# Patient Record
Sex: Female | Born: 1945 | Race: White | Hispanic: No | Smoking: Former smoker
Health system: Southern US, Community
[De-identification: ages and names within clinical notes are randomized; demographics above are authoritative.]

## PROBLEM LIST (undated history)

## (undated) DIAGNOSIS — C50512 Malignant neoplasm of lower-outer quadrant of left female breast: Principal | ICD-10-CM

## (undated) DIAGNOSIS — E785 Hyperlipidemia, unspecified: Secondary | ICD-10-CM

## (undated) DIAGNOSIS — C4490 Unspecified malignant neoplasm of skin, unspecified: Secondary | ICD-10-CM

## (undated) DIAGNOSIS — F329 Major depressive disorder, single episode, unspecified: Secondary | ICD-10-CM

## (undated) DIAGNOSIS — C50919 Malignant neoplasm of unspecified site of unspecified female breast: Secondary | ICD-10-CM

## (undated) DIAGNOSIS — Z923 Personal history of irradiation: Secondary | ICD-10-CM

## (undated) DIAGNOSIS — M1990 Unspecified osteoarthritis, unspecified site: Secondary | ICD-10-CM

## (undated) DIAGNOSIS — F32A Depression, unspecified: Secondary | ICD-10-CM

## (undated) HISTORY — PX: ABDOMINAL HYSTERECTOMY: SHX81

## (undated) HISTORY — DX: Hyperlipidemia, unspecified: E78.5

## (undated) HISTORY — PX: OTHER SURGICAL HISTORY: SHX169

## (undated) HISTORY — PX: FACIAL COSMETIC SURGERY: SHX629

## (undated) HISTORY — PX: BREAST LUMPECTOMY: SHX2

## (undated) HISTORY — PX: BREAST BIOPSY: SHX20

## (undated) HISTORY — DX: Malignant neoplasm of unspecified site of unspecified female breast: C50.919

## (undated) HISTORY — DX: Malignant neoplasm of lower-outer quadrant of left female breast: C50.512

## (undated) HISTORY — DX: Unspecified malignant neoplasm of skin, unspecified: C44.90

## (undated) HISTORY — PX: CHOLECYSTECTOMY: SHX55

---

## 2000-02-21 ENCOUNTER — Encounter: Payer: Self-pay | Admitting: Obstetrics and Gynecology

## 2000-02-21 ENCOUNTER — Encounter: Admission: RE | Admit: 2000-02-21 | Discharge: 2000-02-21 | Payer: Self-pay | Admitting: Obstetrics and Gynecology

## 2001-09-24 ENCOUNTER — Encounter: Admission: RE | Admit: 2001-09-24 | Discharge: 2001-09-24 | Payer: Self-pay | Admitting: Obstetrics and Gynecology

## 2001-09-24 ENCOUNTER — Encounter: Payer: Self-pay | Admitting: Obstetrics and Gynecology

## 2001-09-29 ENCOUNTER — Encounter: Admission: RE | Admit: 2001-09-29 | Discharge: 2001-09-29 | Payer: Self-pay | Admitting: Obstetrics and Gynecology

## 2001-09-29 ENCOUNTER — Encounter: Payer: Self-pay | Admitting: Obstetrics and Gynecology

## 2002-10-02 ENCOUNTER — Encounter: Admission: RE | Admit: 2002-10-02 | Discharge: 2002-10-02 | Payer: Self-pay | Admitting: Obstetrics and Gynecology

## 2002-10-02 ENCOUNTER — Encounter: Payer: Self-pay | Admitting: Obstetrics and Gynecology

## 2003-09-20 ENCOUNTER — Ambulatory Visit (HOSPITAL_COMMUNITY): Admission: RE | Admit: 2003-09-20 | Discharge: 2003-09-20 | Payer: Self-pay | Admitting: Gastroenterology

## 2003-09-20 ENCOUNTER — Encounter (INDEPENDENT_AMBULATORY_CARE_PROVIDER_SITE_OTHER): Payer: Self-pay | Admitting: *Deleted

## 2003-10-08 ENCOUNTER — Encounter: Admission: RE | Admit: 2003-10-08 | Discharge: 2003-10-08 | Payer: Self-pay | Admitting: Obstetrics and Gynecology

## 2004-11-10 ENCOUNTER — Encounter: Admission: RE | Admit: 2004-11-10 | Discharge: 2004-11-10 | Payer: Self-pay | Admitting: Internal Medicine

## 2005-11-27 ENCOUNTER — Encounter: Admission: RE | Admit: 2005-11-27 | Discharge: 2005-11-27 | Payer: Self-pay | Admitting: Internal Medicine

## 2007-02-14 ENCOUNTER — Encounter: Admission: RE | Admit: 2007-02-14 | Discharge: 2007-02-14 | Payer: Self-pay | Admitting: Internal Medicine

## 2008-03-11 ENCOUNTER — Encounter: Admission: RE | Admit: 2008-03-11 | Discharge: 2008-03-11 | Payer: Self-pay | Admitting: Internal Medicine

## 2008-07-11 ENCOUNTER — Emergency Department (HOSPITAL_COMMUNITY): Admission: EM | Admit: 2008-07-11 | Discharge: 2008-07-11 | Payer: Self-pay | Admitting: Emergency Medicine

## 2009-03-14 ENCOUNTER — Encounter: Admission: RE | Admit: 2009-03-14 | Discharge: 2009-03-14 | Payer: Self-pay | Admitting: Internal Medicine

## 2010-02-25 ENCOUNTER — Other Ambulatory Visit: Payer: Self-pay | Admitting: Internal Medicine

## 2010-02-25 DIAGNOSIS — Z1239 Encounter for other screening for malignant neoplasm of breast: Secondary | ICD-10-CM

## 2010-03-27 ENCOUNTER — Ambulatory Visit
Admission: RE | Admit: 2010-03-27 | Discharge: 2010-03-27 | Disposition: A | Discharge status: Home / Self Care | Payer: Medicare Other | Source: Ambulatory Visit | Attending: Internal Medicine | Admitting: Internal Medicine

## 2010-03-27 DIAGNOSIS — Z1239 Encounter for other screening for malignant neoplasm of breast: Secondary | ICD-10-CM

## 2010-06-23 NOTE — Op Note (Signed)
NAME:  Stacey Barrett, Stacey Barrett                       ACCOUNT NO.:  [id number]   MEDICAL RECORD NO.:  [id number]                   PATIENT TYPE:  AMB   LOCATION:  ENDO                                 FACILITY:  [facility]   PHYSICIAN:  Anselmo Rod, M.D.               DATE OF BIRTH:  [birth year]   DATE OF PROCEDURE:  09/20/2003  DATE OF DISCHARGE:                                 OPERATIVE REPORT   PROCEDURE PERFORMED:  Colonoscopy with cold biopsies times two.   ENDOSCOPIST:  Charna Elizabeth, M.D.   INSTRUMENT USED:  Olympus video colonoscope.   INDICATIONS FOR PROCEDURE:  The patient is a 65 year old white female with a  family history of colon cancer undergoing screening colonoscopy.  Rule out  colonic polyps, masses, etc.   PREPROCEDURE PREPARATION:  Informed consent was procured from the patient.  The patient was fasted for eight hours prior to the procedure and prepped  with a bottle of magnesium citrate and a gallon of GoLYTELY the night prior  to the procedure.   PREPROCEDURE PHYSICAL:  The patient had stable vital signs.  Neck supple.  Chest clear to auscultation.  S1 and S2 regular.  Abdomen soft with normal  bowel sounds.   DESCRIPTION OF PROCEDURE:  The patient was placed in left lateral decubitus  position and sedated with 70 mg of Demerol and 7.5 mg of Versed in slow  incremental doses.  Once the patient was adequately sedated and maintained  on low flow oxygen and continuous cardiac monitoring, the Olympus video  colonoscope was advanced from the rectum to the cecum.  The appendicular  orifice and ileocecal valve were clearly visualized and photographed.  A  small sessile polyp was biopsied from the cecal base.  The patient's  position was changed from the left lateral to the supine position with  gentle application of abdominal pressure to reach the cecal base.  The  terminal ileum appeared healthy and without lesions.  Small internal  hemorrhoids were seen on retroflexion.   The rest of the colonic mucosa  appeared healthy.  There was no evidence of diverticulosis.  The patient  tolerated the procedure well without immediate complications.   IMPRESSION:  1. Small nonbleeding internal hemorrhoids.  2. Small sessile polyp biopsied from the cecal base times two (cold     biopsied).  3. No evidence of diverticulosis.  4. Normal terminal ileum.   RECOMMENDATIONS:  1. Await pathology results.  2. Repeat colorectal cancer screening depending on pathology results.  3. Outpatient followup as need arises in the future.  4. Continue high fiber diet with liberal fluid intake.                                               Anselmo Rod, M.D.  JNM/MEDQ  D:  09/20/2003  T:  09/20/2003  Job:  [id number]   cc:   Merlene Laughter. Renae Gloss, M.D.  [address]  [address]  [place]  [place] [address]  Fax: [phone]

## 2011-01-18 ENCOUNTER — Other Ambulatory Visit: Payer: Self-pay | Admitting: Ophthalmology

## 2011-02-21 ENCOUNTER — Other Ambulatory Visit: Payer: Self-pay | Admitting: Internal Medicine

## 2011-02-21 DIAGNOSIS — Z1231 Encounter for screening mammogram for malignant neoplasm of breast: Secondary | ICD-10-CM

## 2011-04-03 ENCOUNTER — Ambulatory Visit
Admission: RE | Admit: 2011-04-03 | Discharge: 2011-04-03 | Disposition: A | Discharge status: Home / Self Care | Payer: Medicare Other | Source: Ambulatory Visit | Attending: Internal Medicine | Admitting: Internal Medicine

## 2011-04-03 DIAGNOSIS — Z1231 Encounter for screening mammogram for malignant neoplasm of breast: Secondary | ICD-10-CM

## 2011-04-06 ENCOUNTER — Other Ambulatory Visit: Payer: Self-pay | Admitting: Internal Medicine

## 2011-04-06 DIAGNOSIS — R928 Other abnormal and inconclusive findings on diagnostic imaging of breast: Secondary | ICD-10-CM

## 2011-04-17 ENCOUNTER — Ambulatory Visit
Admission: RE | Admit: 2011-04-17 | Discharge: 2011-04-17 | Disposition: A | Discharge status: Home / Self Care | Payer: Medicare Other | Source: Ambulatory Visit | Attending: Internal Medicine | Admitting: Internal Medicine

## 2011-04-17 DIAGNOSIS — R928 Other abnormal and inconclusive findings on diagnostic imaging of breast: Secondary | ICD-10-CM

## 2012-03-27 ENCOUNTER — Other Ambulatory Visit: Payer: Self-pay | Admitting: Internal Medicine

## 2012-04-03 ENCOUNTER — Other Ambulatory Visit: Payer: Self-pay

## 2012-04-03 DIAGNOSIS — Z1231 Encounter for screening mammogram for malignant neoplasm of breast: Secondary | ICD-10-CM

## 2012-04-24 ENCOUNTER — Ambulatory Visit
Admission: RE | Admit: 2012-04-24 | Discharge: 2012-04-24 | Disposition: A | Discharge status: Home / Self Care | Payer: Medicare Other | Source: Ambulatory Visit | Attending: Internal Medicine | Admitting: Internal Medicine

## 2012-04-24 ENCOUNTER — Ambulatory Visit: Payer: Medicare Other

## 2012-04-24 DIAGNOSIS — Z1231 Encounter for screening mammogram for malignant neoplasm of breast: Secondary | ICD-10-CM

## 2012-04-24 DIAGNOSIS — Z803 Family history of malignant neoplasm of breast: Secondary | ICD-10-CM

## 2012-04-29 ENCOUNTER — Other Ambulatory Visit: Payer: Self-pay | Admitting: Internal Medicine

## 2012-04-29 DIAGNOSIS — R928 Other abnormal and inconclusive findings on diagnostic imaging of breast: Secondary | ICD-10-CM

## 2012-05-06 ENCOUNTER — Ambulatory Visit
Admission: RE | Admit: 2012-05-06 | Discharge: 2012-05-06 | Disposition: A | Discharge status: Home / Self Care | Payer: Medicare Other | Source: Ambulatory Visit | Attending: Internal Medicine | Admitting: Internal Medicine

## 2012-05-06 DIAGNOSIS — R928 Other abnormal and inconclusive findings on diagnostic imaging of breast: Secondary | ICD-10-CM

## 2012-05-09 ENCOUNTER — Other Ambulatory Visit: Payer: Medicare Other

## 2013-03-18 ENCOUNTER — Other Ambulatory Visit: Payer: Self-pay

## 2013-03-18 DIAGNOSIS — Z1231 Encounter for screening mammogram for malignant neoplasm of breast: Secondary | ICD-10-CM

## 2013-05-04 ENCOUNTER — Ambulatory Visit
Admission: RE | Admit: 2013-05-04 | Discharge: 2013-05-04 | Disposition: A | Discharge status: Home / Self Care | Payer: Medicare Other | Source: Ambulatory Visit

## 2013-05-04 DIAGNOSIS — Z1231 Encounter for screening mammogram for malignant neoplasm of breast: Secondary | ICD-10-CM

## 2013-11-24 ENCOUNTER — Ambulatory Visit
Admission: RE | Admit: 2013-11-24 | Discharge: 2013-11-24 | Disposition: A | Discharge status: Home / Self Care | Payer: Medicare Other | Source: Ambulatory Visit | Attending: Internal Medicine | Admitting: Internal Medicine

## 2013-11-24 ENCOUNTER — Other Ambulatory Visit: Payer: Self-pay | Admitting: Internal Medicine

## 2013-11-24 DIAGNOSIS — M25551 Pain in right hip: Secondary | ICD-10-CM

## 2013-11-24 DIAGNOSIS — M25552 Pain in left hip: Principal | ICD-10-CM

## 2014-01-13 ENCOUNTER — Emergency Department (HOSPITAL_COMMUNITY): Payer: Medicare Other

## 2014-01-13 ENCOUNTER — Encounter (HOSPITAL_COMMUNITY): Payer: Self-pay | Admitting: Emergency Medicine

## 2014-01-13 ENCOUNTER — Emergency Department (HOSPITAL_COMMUNITY)
Admission: EM | Admit: 2014-01-13 | Discharge: 2014-01-13 | Disposition: A | Discharge status: Home / Self Care | Payer: Medicare Other | Attending: Emergency Medicine | Admitting: Emergency Medicine

## 2014-01-13 DIAGNOSIS — R1032 Left lower quadrant pain: Secondary | ICD-10-CM | POA: Diagnosis present

## 2014-01-13 DIAGNOSIS — Z9049 Acquired absence of other specified parts of digestive tract: Secondary | ICD-10-CM | POA: Insufficient documentation

## 2014-01-13 DIAGNOSIS — Z90710 Acquired absence of both cervix and uterus: Secondary | ICD-10-CM | POA: Diagnosis not present

## 2014-01-13 DIAGNOSIS — N23 Unspecified renal colic: Secondary | ICD-10-CM | POA: Diagnosis not present

## 2014-01-13 LAB — URINE MICROSCOPIC-ADD ON

## 2014-01-13 LAB — CBC WITH DIFFERENTIAL/PLATELET
BASOS ABS: 0.1 10*3/uL (ref 0.0–0.1)
Basophils Relative: 1 % (ref 0–1)
EOS ABS: 0.1 10*3/uL (ref 0.0–0.7)
EOS PCT: 1 % (ref 0–5)
HEMATOCRIT: 43.2 % (ref 36.0–46.0)
Hemoglobin: 13.9 g/dL (ref 12.0–15.0)
LYMPHS PCT: 11 % — AB (ref 12–46)
Lymphs Abs: 1.2 10*3/uL (ref 0.7–4.0)
MCH: 29.4 pg (ref 26.0–34.0)
MCHC: 32.2 g/dL (ref 30.0–36.0)
MCV: 91.5 fL (ref 78.0–100.0)
MONO ABS: 1.1 10*3/uL — AB (ref 0.1–1.0)
Monocytes Relative: 11 % (ref 3–12)
Neutro Abs: 8 10*3/uL — ABNORMAL HIGH (ref 1.7–7.7)
Neutrophils Relative %: 76 % (ref 43–77)
PLATELETS: 263 10*3/uL (ref 150–400)
RBC: 4.72 MIL/uL (ref 3.87–5.11)
RDW: 14 % (ref 11.5–15.5)
WBC: 10.4 10*3/uL (ref 4.0–10.5)

## 2014-01-13 LAB — URINALYSIS, ROUTINE W REFLEX MICROSCOPIC
Glucose, UA: NEGATIVE mg/dL
Ketones, ur: NEGATIVE mg/dL
Nitrite: NEGATIVE
SPECIFIC GRAVITY, URINE: 1.037 — AB (ref 1.005–1.030)
Urobilinogen, UA: 1 mg/dL (ref 0.0–1.0)
pH: 5.5 (ref 5.0–8.0)

## 2014-01-13 LAB — BASIC METABOLIC PANEL
ANION GAP: 16 — AB (ref 5–15)
BUN: 26 mg/dL — AB (ref 6–23)
CO2: 21 mEq/L (ref 19–32)
Calcium: 10.1 mg/dL (ref 8.4–10.5)
Chloride: 97 mEq/L (ref 96–112)
Creatinine, Ser: 0.87 mg/dL (ref 0.50–1.10)
GFR calc Af Amer: 76 mL/min — ABNORMAL LOW (ref >90)
GFR, EST NON AFRICAN AMERICAN: 66 mL/min — AB (ref >90)
GLUCOSE: 113 mg/dL — AB (ref 70–99)
Potassium: 4.3 mEq/L (ref 3.7–5.3)
SODIUM: 134 meq/L — AB (ref 137–147)

## 2014-01-13 MED ORDER — ONDANSETRON HCL 4 MG PO TABS: 4.0000 mg | ORAL_TABLET | Freq: Four times a day (QID) | ORAL | Status: DC

## 2014-01-13 MED ORDER — HYDROCODONE-ACETAMINOPHEN 5-325 MG PO TABS: 1.0000 | ORAL_TABLET | Freq: Four times a day (QID) | ORAL | Status: DC | PRN

## 2014-01-13 MED ORDER — TAMSULOSIN HCL 0.4 MG PO CAPS: 0.4000 mg | ORAL_CAPSULE | Freq: Every day | ORAL | Status: DC

## 2014-01-13 MED ORDER — HYDROMORPHONE HCL 1 MG/ML IJ SOLN
1.0000 mg | Freq: Once | INTRAMUSCULAR | Status: AC
  Administered 2014-01-13: 1 mg via INTRAVENOUS
  Filled 2014-01-13: qty 1

## 2014-01-13 MED ORDER — SODIUM CHLORIDE 0.9 % IV BOLUS (SEPSIS)
1000.0000 mL | Freq: Once | INTRAVENOUS | Status: AC
  Administered 2014-01-13: 1000 mL via INTRAVENOUS

## 2014-01-13 NOTE — ED Notes (Signed)
Pt is having left side flank and blood in urine, doctor sent pt over to rule lout kidney stone.

## 2014-01-13 NOTE — ED Provider Notes (Signed)
CSN: [id number]     Arrival date & time 01/13/14  2423 History   First MD Initiated Contact with Patient 01/13/14 0930     Chief Complaint  Patient presents with  . Flank Pain    left      Patient is a 68 y.o. female presenting with flank pain. The history is provided by the patient.  Flank Pain   Pt presents for evaluation of left lower quadrant pain.  For the last 2 days this patient has had pain in the left lower quadrant that is sharp and intermittent. It is nonradiating. The pain comes on she feels more comfortable with ambulation. She's had multiple episodes of vomiting and chills at home. She reports dysuria and hematuria. She was seen by her PCP on Monday who diagnosed her with a urinary tract infection and she was started on Cipro twice a day. She's been taking the Cipro as prescribed and her vomiting has decreased. She does report continued nausea. She has no reported fevers. He developed increased hematuria with passing clots in her urine and she comes in for further evaluation. She denies any flank pain, cough, chest pain. She denies history of kidney stones. She takes no blood thinners. She lives alone. Symptoms are moderate, intermittent, worsening.  History reviewed. No pertinent past medical history. Past Surgical History  Procedure Laterality Date  . Cholecystectomy    . Abdominal hysterectomy     No family history on file. History  Substance Use Topics  . Smoking status: Never Smoker   . Smokeless tobacco: Not on file  . Alcohol Use: Yes     Comment: ocassional   OB History    No data available     Review of Systems  Genitourinary: Positive for flank pain.  All other systems reviewed and are negative.     Allergies  Review of patient's allergies indicates no known allergies.  Home Medications   Prior to Admission medications   Medication Sig Start Date End Date Taking? Authorizing Provider  ciprofloxacin (CIPRO) 500 MG tablet Take 500 mg by mouth 2  (two) times daily. For 7 days 01/11/14  Yes Historical Provider, MD  ibuprofen (ADVIL,MOTRIN) 200 MG tablet Take 400 mg by mouth every 6 (six) hours as needed for moderate pain.   Yes Historical Provider, MD  promethazine (PHENERGAN) 25 MG tablet Take 25 mg by mouth every 6 (six) hours as needed for nausea.   Yes Historical Provider, MD  sertraline (ZOLOFT) 50 MG tablet Take 50 mg by mouth at bedtime.   Yes Historical Provider, MD   BP 153/72 mmHg  Pulse 93  Temp(Src) 97.9 F (36.6 C) (Oral)  Resp 20  SpO2 97% Physical Exam  Constitutional: She is oriented to person, place, and time. She appears well-developed and well-nourished.  HENT:  Head: Normocephalic and atraumatic.  Cardiovascular: Normal rate and regular rhythm.   No murmur heard. Pulmonary/Chest: Effort normal and breath sounds normal. No respiratory distress.  Abdominal: Soft. There is no rebound and no guarding.  Mild LLQ tenderness  Musculoskeletal: She exhibits no edema or tenderness.  Neurological: She is alert and oriented to person, place, and time.  Skin: Skin is warm and dry.  Psychiatric: She has a normal mood and affect. Her behavior is normal.  Nursing note and vitals reviewed.   ED Course  Procedures (including critical care time) Labs Review Labs Reviewed  URINALYSIS, ROUTINE W REFLEX MICROSCOPIC - Abnormal; Notable for the following:    Color, Urine RED (*)  APPearance TURBID (*)    Specific Gravity, Urine 1.037 (*)    Hgb urine dipstick LARGE (*)    Bilirubin Urine SMALL (*)    Protein, ur >300 (*)    Leukocytes, UA SMALL (*)    All other components within normal limits  BASIC METABOLIC PANEL - Abnormal; Notable for the following:    Sodium 134 (*)    Glucose, Bld 113 (*)    BUN 26 (*)    GFR calc non Af Amer 66 (*)    GFR calc Af Amer 76 (*)    Anion gap 16 (*)    All other components within normal limits  CBC WITH DIFFERENTIAL - Abnormal; Notable for the following:    Neutro Abs 8.0 (*)     Lymphocytes Relative 11 (*)    Monocytes Absolute 1.1 (*)    All other components within normal limits  URINE MICROSCOPIC-ADD ON - Abnormal; Notable for the following:    Bacteria, UA FEW (*)    Crystals CA OXALATE CRYSTALS (*)    All other components within normal limits  URINE CULTURE    Imaging Review Ct Abdomen Pelvis Wo Contrast  01/13/2014   CLINICAL DATA:  68 year old female with history of left-sided flank pain and hematuria. Evaluate for potential kidney stone.  EXAM: CT ABDOMEN AND PELVIS WITHOUT CONTRAST  TECHNIQUE: Multidetector CT imaging of the abdomen and pelvis was performed following the standard protocol without IV contrast.  COMPARISON:  No priors.  FINDINGS: Lower chest: Incompletely visualized nodule in the right lower lobe measuring at least 5 mm (image 1 of series 4). There is also an incompletely visualized ground-glass attenuation nodule in the anterior aspect the left lower lobe (image 1 of series 4) measuring 9 x 7 mm.  Hepatobiliary: Status post cholecystectomy. Several subcentimeter low attenuation lesions in the liver are too small to definitively characterize, but are favored to represent tiny cysts. Status post cholecystectomy. Common bile duct measures up to 9 mm in the porta hepatis, likely within normal limits for this patient's age and post cholecystectomy status.  Pancreas: Unremarkable.  Spleen: Unremarkable.  Adrenals/Urinary Tract: 5 mm calculus at or immediately beyond the left ureterovesicular junction (image 77 of series 2). This is associated with moderate left hydroureteronephrosis and extensive perinephric stranding, indicative of obstruction at this time. No additional calculi are identified within the collecting system of either kidney, along the course of the right ureter, or within the lumen of the urinary bladder. The unenhanced appearance of the kidneys is otherwise unremarkable bilaterally.  Stomach/Bowel: The unenhanced appearance of the stomach is  normal. No pathologic dilatation of small bowel or colon.  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 No lymphadenopathy noted in the abdomen or pelvis on today's non contrast CT examination.  Reproductive: Status post hysterectomy. Ovaries are not confidently identified may be surgically absent or atrophic.  Other: No significant volume of ascites.  No pneumoperitoneum.  Musculoskeletal: There are no aggressive appearing lytic or blastic lesions noted in the visualized portions of the skeleton. Degenerative changes are noted adjacent to the symphysis pubis.  IMPRESSION: 1. 5 mm calculus at or immediately beyond the left ureterovesicular junction associated with moderate left hydroureteronephrosis indicative of urinary tract obstruction at this time. 2. No additional urinary tract calculi noted. 3. Small nodules in the lower lobes of the lungs bilaterally, as above. Followup noncontrast chest CT is recommended in 3 months for initial evaluation and to establish a baseline for  future followup, particularly given the left lower lobe ground-glass attenuation nodule. This recommendation follows the consensus statement: Recommendations for the Management of Subsolid Pulmonary Nodules Detected at CT: A Statement from the [place] as published in Radiology 2013; 266:304-317. 4. Atherosclerosis. 5. Additional incidental findings, as above.   Electronically Signed   By: Vinnie Langton M.D.   On: 01/13/2014 10:34     EKG Interpretation None      MDM   Final diagnoses:  None    Patient here for evaluation of hematuria and left lower quadrant pain. CT scan shows obstructing 5 mm calculus. UA is equivocal for UTI and patient has been on ciprofloxacin. Discussed case with Dr. Matilde Sprang who recommends continuing outpatient antibiotics with close return precautions and outpatient follow-up. Patient's pain is resolved in the emergency  department and she has had no new vomiting here. Discussed with patient's close return precautions as well as home care for renal colic.    Quintella Reichert, MD 01/13/14 1739

## 2014-01-13 NOTE — Progress Notes (Signed)
[facility] ED CM noted pt with coverage but no pcp listed Spoke with pt who confirms her pcp is Stacey Barrett EPIC updated  [facility] ED CM spoke with pt on how to obtain an in network specialists if needed with insurance coverage via the customer service number or web site  Cm reviewed ED level of care for crisis/emergent services and community pcp level of care to manage continuous or chronic medical concerns. The pt voiced understanding CM encouraged pt and discussed pt's responsibility to verify with pt's insurance carrier that any recommended medical provider offered by any emergency room or a hospital provider is within the carrier's network. The pt voiced understanding  Answered questions about tests (CT, MRI) to evaluate her abdominal complaint

## 2014-01-13 NOTE — Discharge Instructions (Signed)
You have a stone in your left ureter that is about 5 mm in size.  This stone is causing your pain.  Continue taking your Ciprofloxacin until all pills are gone.  Follow up with the Urologist for recheck.  Come back to the [facility] Emergency Department immediately if you develop fevers or new concerning symptoms.  Your CT scan showed incidental findings such as small nodules on your lungs, these need to followed up by your family doctor.  Please see your family doctor in the next week.    Kidney Stones Kidney stones (urolithiasis) are deposits that form inside your kidneys. The intense pain is caused by the stone moving through the urinary tract. When the stone moves, the ureter goes into spasm around the stone. The stone is usually passed in the urine.  CAUSES   A disorder that makes certain neck glands produce too much parathyroid hormone (primary hyperparathyroidism).  A buildup of uric acid crystals, similar to gout in your joints.  Narrowing (stricture) of the ureter.  A kidney obstruction present at birth (congenital obstruction).  Previous surgery on the kidney or ureters.  Numerous kidney infections. SYMPTOMS   Feeling sick to your stomach (nauseous).  Throwing up (vomiting).  Blood in the urine (hematuria).  Pain that usually spreads (radiates) to the groin.  Frequency or urgency of urination. DIAGNOSIS   Taking a history and physical exam.  Blood or urine tests.  CT scan.  Occasionally, an examination of the inside of the urinary bladder (cystoscopy) is performed. TREATMENT   Observation.  Increasing your fluid intake.  Extracorporeal shock wave lithotripsy--This is a noninvasive procedure that uses shock waves to break up kidney stones.  Surgery may be needed if you have severe pain or persistent obstruction. There are various surgical procedures. Most of the procedures are performed with the use of small instruments. Only small incisions are needed to  accommodate these instruments, so recovery time is minimized. The size, location, and chemical composition are all important variables that will determine the proper choice of action for you. Talk to your health care provider to better understand your situation so that you will minimize the risk of injury to yourself and your kidney.  HOME CARE INSTRUCTIONS   Drink enough water and fluids to keep your urine clear or pale yellow. This will help you to pass the stone or stone fragments.  Strain all urine through the provided strainer. Keep all particulate matter and stones for your health care provider to see. The stone causing the pain may be as small as a grain of salt. It is very important to use the strainer each and every time you pass your urine. The collection of your stone will allow your health care provider to analyze it and verify that a stone has actually passed. The stone analysis will often identify what you can do to reduce the incidence of recurrences.  Only take over-the-counter or prescription medicines for pain, discomfort, or fever as directed by your health care provider.  Make a follow-up appointment with your health care provider as directed.  Get follow-up X-rays if required. The absence of pain does not always mean that the stone has passed. It may have only stopped moving. If the urine remains completely obstructed, it can cause loss of kidney function or even complete destruction of the kidney. It is your responsibility to make sure X-rays and follow-ups are completed. Ultrasounds of the kidney can show blockages and the status of the kidney. Ultrasounds  are not associated with any radiation and can be performed easily in a matter of minutes. SEEK MEDICAL CARE IF:  You experience pain that is progressive and unresponsive to any pain medicine you have been prescribed. SEEK IMMEDIATE MEDICAL CARE IF:   Pain cannot be controlled with the prescribed medicine.  You have a  fever or shaking chills.  The severity or intensity of pain increases over 18 hours and is not relieved by pain medicine.  You develop a new onset of abdominal pain.  You feel faint or pass out.  You are unable to urinate. MAKE SURE YOU:   Understand these instructions.  Will watch your condition.  Will get help right away if you are not doing well or get worse. Document Released: 01/22/2005 Document Revised: 09/24/2012 Document Reviewed: 06/25/2012 [facility] Patient Information 2015 [place], [place]. This information is not intended to replace advice given to you by your health care provider. Make sure you discuss any questions you have with your health care provider.

## 2014-01-14 LAB — URINE CULTURE: Colony Count: 30000

## 2014-03-29 ENCOUNTER — Other Ambulatory Visit: Payer: Self-pay

## 2014-03-29 DIAGNOSIS — Z1231 Encounter for screening mammogram for malignant neoplasm of breast: Secondary | ICD-10-CM

## 2014-04-21 ENCOUNTER — Other Ambulatory Visit: Payer: Self-pay | Admitting: Internal Medicine

## 2014-04-21 DIAGNOSIS — R911 Solitary pulmonary nodule: Secondary | ICD-10-CM

## 2014-04-26 ENCOUNTER — Ambulatory Visit
Admission: RE | Admit: 2014-04-26 | Discharge: 2014-04-26 | Disposition: A | Discharge status: Home / Self Care | Payer: Medicare Other | Source: Ambulatory Visit | Attending: Internal Medicine | Admitting: Internal Medicine

## 2014-04-26 DIAGNOSIS — R911 Solitary pulmonary nodule: Secondary | ICD-10-CM

## 2014-05-06 ENCOUNTER — Ambulatory Visit
Admission: RE | Admit: 2014-05-06 | Discharge: 2014-05-06 | Disposition: A | Discharge status: Home / Self Care | Payer: Medicare Other | Source: Ambulatory Visit

## 2014-05-06 DIAGNOSIS — Z1231 Encounter for screening mammogram for malignant neoplasm of breast: Secondary | ICD-10-CM

## 2015-05-04 ENCOUNTER — Other Ambulatory Visit: Payer: Self-pay

## 2015-05-04 DIAGNOSIS — Z1231 Encounter for screening mammogram for malignant neoplasm of breast: Secondary | ICD-10-CM

## 2015-05-12 ENCOUNTER — Ambulatory Visit
Admission: RE | Admit: 2015-05-12 | Discharge: 2015-05-12 | Disposition: A | Discharge status: Home / Self Care | Payer: Medicare Other | Source: Ambulatory Visit

## 2015-05-12 DIAGNOSIS — Z1231 Encounter for screening mammogram for malignant neoplasm of breast: Secondary | ICD-10-CM

## 2015-05-13 ENCOUNTER — Other Ambulatory Visit: Payer: Self-pay | Admitting: Internal Medicine

## 2015-05-13 DIAGNOSIS — R928 Other abnormal and inconclusive findings on diagnostic imaging of breast: Secondary | ICD-10-CM

## 2015-05-16 ENCOUNTER — Other Ambulatory Visit: Payer: Self-pay | Admitting: Internal Medicine

## 2015-05-16 DIAGNOSIS — R928 Other abnormal and inconclusive findings on diagnostic imaging of breast: Secondary | ICD-10-CM

## 2015-05-20 ENCOUNTER — Ambulatory Visit
Admission: RE | Admit: 2015-05-20 | Discharge: 2015-05-20 | Disposition: A | Discharge status: Home / Self Care | Payer: Medicare Other | Source: Ambulatory Visit | Attending: Internal Medicine | Admitting: Internal Medicine

## 2015-05-20 ENCOUNTER — Other Ambulatory Visit: Payer: Self-pay | Admitting: Internal Medicine

## 2015-05-20 DIAGNOSIS — R928 Other abnormal and inconclusive findings on diagnostic imaging of breast: Secondary | ICD-10-CM

## 2015-05-20 DIAGNOSIS — N6320 Unspecified lump in the left breast, unspecified quadrant: Secondary | ICD-10-CM

## 2015-05-30 ENCOUNTER — Ambulatory Visit
Admission: RE | Admit: 2015-05-30 | Discharge: 2015-05-30 | Disposition: A | Discharge status: Home / Self Care | Payer: Medicare Other | Source: Ambulatory Visit | Attending: Internal Medicine | Admitting: Internal Medicine

## 2015-05-30 ENCOUNTER — Other Ambulatory Visit: Payer: Self-pay | Admitting: Internal Medicine

## 2015-05-30 DIAGNOSIS — N6320 Unspecified lump in the left breast, unspecified quadrant: Secondary | ICD-10-CM

## 2015-06-01 ENCOUNTER — Telehealth: Payer: Self-pay | Admitting: *Deleted

## 2015-06-01 ENCOUNTER — Encounter: Payer: Self-pay | Admitting: *Deleted

## 2015-06-01 DIAGNOSIS — C50512 Malignant neoplasm of lower-outer quadrant of left female breast: Secondary | ICD-10-CM

## 2015-06-01 DIAGNOSIS — Z170 Estrogen receptor positive status [ER+]: Secondary | ICD-10-CM

## 2015-06-01 HISTORY — DX: Malignant neoplasm of lower-outer quadrant of left female breast: C50.512

## 2015-06-01 NOTE — Telephone Encounter (Signed)
Confirmed [facility] for 06/15/15 at 1215 .  Instructions and contact information given.

## 2015-06-01 NOTE — Telephone Encounter (Signed)
Left vm for pt to return to discuss Bee on 5/10. Contact information provided.

## 2015-06-15 ENCOUNTER — Ambulatory Visit: Payer: Medicare Other | Attending: General Surgery | Admitting: Physical Therapy

## 2015-06-15 ENCOUNTER — Ambulatory Visit
Admission: RE | Admit: 2015-06-15 | Discharge: 2015-06-15 | Disposition: A | Discharge status: Home / Self Care | Payer: Medicare Other | Source: Ambulatory Visit | Attending: Radiation Oncology | Admitting: Radiation Oncology

## 2015-06-15 ENCOUNTER — Encounter: Payer: Self-pay | Admitting: Oncology

## 2015-06-15 ENCOUNTER — Other Ambulatory Visit (HOSPITAL_BASED_OUTPATIENT_CLINIC_OR_DEPARTMENT_OTHER): Payer: Medicare Other

## 2015-06-15 ENCOUNTER — Ambulatory Visit (HOSPITAL_BASED_OUTPATIENT_CLINIC_OR_DEPARTMENT_OTHER): Payer: Medicare Other | Admitting: Oncology

## 2015-06-15 ENCOUNTER — Other Ambulatory Visit: Payer: Self-pay | Admitting: General Surgery

## 2015-06-15 ENCOUNTER — Encounter: Payer: Self-pay | Admitting: Physical Therapy

## 2015-06-15 VITALS — BP 144/83 | HR 96 | Temp 98.2°F | Resp 18 | Ht 70.0 in | Wt 163.2 lb

## 2015-06-15 DIAGNOSIS — Z803 Family history of malignant neoplasm of breast: Secondary | ICD-10-CM | POA: Diagnosis not present

## 2015-06-15 DIAGNOSIS — C50512 Malignant neoplasm of lower-outer quadrant of left female breast: Secondary | ICD-10-CM | POA: Diagnosis present

## 2015-06-15 DIAGNOSIS — Z170 Estrogen receptor positive status [ER+]: Secondary | ICD-10-CM | POA: Diagnosis not present

## 2015-06-15 DIAGNOSIS — Z800 Family history of malignant neoplasm of digestive organs: Secondary | ICD-10-CM | POA: Diagnosis not present

## 2015-06-15 DIAGNOSIS — Z86711 Personal history of pulmonary embolism: Secondary | ICD-10-CM

## 2015-06-15 DIAGNOSIS — R293 Abnormal posture: Secondary | ICD-10-CM | POA: Insufficient documentation

## 2015-06-15 DIAGNOSIS — Z85828 Personal history of other malignant neoplasm of skin: Secondary | ICD-10-CM

## 2015-06-15 DIAGNOSIS — Z807 Family history of other malignant neoplasms of lymphoid, hematopoietic and related tissues: Secondary | ICD-10-CM

## 2015-06-15 LAB — COMPREHENSIVE METABOLIC PANEL
ALT: 16 U/L (ref 0–55)
AST: 15 U/L (ref 5–34)
Albumin: 4.4 g/dL (ref 3.5–5.0)
Alkaline Phosphatase: 80 U/L (ref 40–150)
Anion Gap: 10 mEq/L (ref 3–11)
BUN: 20.8 mg/dL (ref 7.0–26.0)
CHLORIDE: 104 meq/L (ref 98–109)
CO2: 25 meq/L (ref 22–29)
Calcium: 10.1 mg/dL (ref 8.4–10.4)
Creatinine: 0.8 mg/dL (ref 0.6–1.1)
EGFR: 74 mL/min/{1.73_m2} — AB (ref >90)
GLUCOSE: 83 mg/dL (ref 70–140)
POTASSIUM: 4.2 meq/L (ref 3.5–5.1)
Sodium: 139 mEq/L (ref 136–145)
Total Bilirubin: 0.58 mg/dL (ref 0.20–1.20)
Total Protein: 7.9 g/dL (ref 6.4–8.3)

## 2015-06-15 LAB — CBC WITH DIFFERENTIAL/PLATELET
BASO%: 0.9 % (ref 0.0–2.0)
BASOS ABS: 0.1 10*3/uL (ref 0.0–0.1)
EOS%: 3.4 % (ref 0.0–7.0)
Eosinophils Absolute: 0.3 10*3/uL (ref 0.0–0.5)
HCT: 44.7 % (ref 34.8–46.6)
HGB: 14.9 g/dL (ref 11.6–15.9)
LYMPH%: 28.8 % (ref 14.0–49.7)
MCH: 29.6 pg (ref 25.1–34.0)
MCHC: 33.3 g/dL (ref 31.5–36.0)
MCV: 88.9 fL (ref 79.5–101.0)
MONO#: 0.9 10*3/uL (ref 0.1–0.9)
MONO%: 9.4 % (ref 0.0–14.0)
NEUT#: 5.8 10*3/uL (ref 1.5–6.5)
NEUT%: 57.5 % (ref 38.4–76.8)
Platelets: 315 10*3/uL (ref 145–400)
RBC: 5.02 10*6/uL (ref 3.70–5.45)
RDW: 14.6 % — AB (ref 11.2–14.5)
WBC: 10.1 10*3/uL (ref 3.9–10.3)
lymph#: 2.9 10*3/uL (ref 0.9–3.3)

## 2015-06-15 NOTE — Progress Notes (Signed)
Stacey Barrett  [phone] [phone]     ID: LANISHA STEPANIAN DOB: 1945/02/21  MR#: [id number]  EXH#:[id number]  Patient Care Team: Willey Blade, MD as PCP - General (Internal Medicine) Chauncey Cruel, MD as Consulting Physician (Oncology) Fanny Skates, MD as Consulting Physician (General Surgery) Eppie Gibson, MD as Attending Physician (Radiation Oncology) Kennieth Francois, MD (Dermatology) Juanita Craver, MD as Consulting Physician (Gastroenterology) PCP: Salena Saner., MD GYN: OTHER MD:  CHIEF COMPLAINT: estrogen receptor positive breast cancer  CURRENT TREATMENT: awaiting definitive surgery   BREAST CANCER HISTORY: Brigetta had routine lateral mammographic screening with tomography at the [facility] 05/12/2015 showing an area of possible asymmetry in the right breast and a possible mass in the left breast. Diagnostic right mammography with tomography and left breast ultrasonography was performed 05/20/2015. Breast density was category B. The area of concern in the posterior right breast proved to be a 7 mm normal-appearing intramammary lymph node.  In the left breast however compression views showed a far posterior mass with irregular margins measuring 13 mm. This was palpable deep in the lower outer quadrant of the left breast. Ultrasonography of the left breastconfirmed a 1.6 cm hypoechoic mass with irregular margins at the 5:00 position 3 cm from the nipple. Ultrasound of the axilla showed no suspicious findings.  Biopsy of the left breast mass in question 05/30/2015 showed ([id number]) and invasive ductal carcinoma, grade 2, estrogen receptor 100% positive, addition receptor 95% positive, both with strong staining intensity, with an MIB-1 of 25%, and no HER-2 amplification, the signals ratio being 1.27 and the number per cell 5.55.  Her subsequent treatment is as detailed below    INTERVAL HISTORY: Byanca was evaluated in the  multidisciplinary breast cancer clinic 06/15/2015 accompanied by her friends Jeani Hawking and Verdis Frederickson. Her case was also presented in the multidisciplinary breast cancer conference that same morning. At that time a preliminary plan was proposed: breast conserving surgery with sentinel lymph node sampling, genetics, radiation, consideration of Oncotype, and hormones  REVIEW OF SYSTEMS: There were no specific symptoms leading to the original mammogram, which was routinely scheduled. The patient denies unusual headaches, visual changes, nausea, vomiting, stiff neck, dizziness, or gait imbalance. There has been no cough, phlegm production, or pleurisy, no chest pain or pressure, and no change in bowel or bladder habits. The patient denies fever, rash, bleeding, unexplained fatigue or unexplained weight loss. She admits to some joint pain and vertigo involving her hands and back, which can be throbbing and very intermittent. She has a history of squamous cell carcinomas. She bruises easily. She has sinus allergies. A detailed review of systems was otherwise entirely negative.  PAST MEDICAL HISTORY: Past Medical History  Diagnosis Date  . Breast cancer of lower-outer quadrant of left female breast ([place]) 06/01/2015  . Breast cancer ([place])   . Skin cancer     05/2013  . Hyperlipidemia     PAST SURGICAL HISTORY: Past Surgical History  Procedure Laterality Date  . Cholecystectomy    . Abdominal hysterectomy    . Facial cosmetic surgery      FAMILY HISTORY Family History  Problem Relation Age of Onset  . Breast cancer Mother   . Colon cancer Maternal Uncle   the patient's father died at the age of 39. The patient's mother died at the age of 71. Zakiyah had no brothers, 3 sisters. Her mother was diagnosed with breast cancer at age 62. A maternal uncle was diagnosed with colon  cancer. In addition one of Kemyra's sisters was diagnosed with breast cancer at age 63. A second sister has a history of multiple  myeloma.  GYNECOLOGIC HISTORY:  No LMP recorded. Patient is postmenopausal. Menarche age 51, first live birth age 64, she is [place] P3. She underwent total abdominal hysterectomy with bilateral salpingo-oophorectomy age 70. She took hormone replacement about 3 months and then developed a pulmonary embolus. However she has a history of approximately 8 years on oral contraceptives remotelywith no clots or other complications.  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 She is widowed and lives alone with a standard [name] [place] terrier. Her sons [name] and [name] living [place] and also worked in the [employer]. Son [name] lives in [place] and he is a Development worker, international aid. The patient has 3 grandchildren. She is not a church attender    ADVANCED DIRECTIVES: in place. The patient's son [name] is her healthcare power of attorney. He can be reached at [phone].   HEALTH MAINTENANCE: Social History  Substance Use Topics  . Smoking status: Former Research scientist (life sciences)  . Smokeless tobacco: None  . Alcohol Use: Yes     Comment: ocassional     Colonoscopy:  PAP:  Bone density:  Lipid panel:  No Known Allergies  No current outpatient prescriptions on file.   No current facility-administered medications for this visit.    OBJECTIVE: middle-aged white woman in no acute distress Filed Vitals:   06/15/15 1229  BP: 144/83  Pulse: 96  Temp: 98.2 F (36.8 C)  Resp: 18     Body mass index is 23.42 kg/(m^2).    ECOG FS:0 - Asymptomatic  Ocular: Sclerae unicteric, pupils equal, round and reactive to light Ear-nose-throat: Oropharynx clear and moist Lymphatic: No cervical or supraclavicular adenopathy Lungs no rales or rhonchi, good excursion bilaterally Heart regular rate and rhythm, no murmur appreciated Abd soft, nontender, positive bowel sounds MSK no focal spinal tenderness, no joint edema Neuro: non-focal, well-oriented, appropriate affect Breasts: the right  breast is unremarkable. The left breast is status post  Recent biopsy. There is a significant ecchymosis. Aside from that I do not palpate a well-defined mass. There are no skin or nipple changes of concern. The left axilla is benign.   LAB RESULTS:  CMP     Component Value Date/Time   NA 139 06/15/2015 1211   NA 134* 01/13/2014 0956   K 4.2 06/15/2015 1211   K 4.3 01/13/2014 0956   CL 97 01/13/2014 0956   CO2 25 06/15/2015 1211   CO2 21 01/13/2014 0956   GLUCOSE 83 06/15/2015 1211   GLUCOSE 113* 01/13/2014 0956   BUN 20.8 06/15/2015 1211   BUN 26* 01/13/2014 0956   CREATININE 0.8 06/15/2015 1211   CREATININE 0.87 01/13/2014 0956   CALCIUM 10.1 06/15/2015 1211   CALCIUM 10.1 01/13/2014 0956   PROT 7.9 06/15/2015 1211   ALBUMIN 4.4 06/15/2015 1211   AST 15 06/15/2015 1211   ALT 16 06/15/2015 1211   ALKPHOS 80 06/15/2015 1211   BILITOT 0.58 06/15/2015 1211   GFRNONAA 66* 01/13/2014 0956   GFRAA 76* 01/13/2014 0956    INo results found for: SPEP, UPEP  Lab Results  Component Value Date   WBC 10.1 06/15/2015   NEUTROABS 5.8 06/15/2015   HGB 14.9 06/15/2015   HCT 44.7 06/15/2015   MCV 88.9 06/15/2015   PLT 315 06/15/2015      Chemistry      Component Value  Date/Time   NA 139 06/15/2015 1211   NA 134* 01/13/2014 0956   K 4.2 06/15/2015 1211   K 4.3 01/13/2014 0956   CL 97 01/13/2014 0956   CO2 25 06/15/2015 1211   CO2 21 01/13/2014 0956   BUN 20.8 06/15/2015 1211   BUN 26* 01/13/2014 0956   CREATININE 0.8 06/15/2015 1211   CREATININE 0.87 01/13/2014 0956      Component Value Date/Time   CALCIUM 10.1 06/15/2015 1211   CALCIUM 10.1 01/13/2014 0956   ALKPHOS 80 06/15/2015 1211   AST 15 06/15/2015 1211   ALT 16 06/15/2015 1211   BILITOT 0.58 06/15/2015 1211       No results found for: LABCA2  No components found for: LABCA125  No results for input(s): INR in the last 168 hours.  Urinalysis    Component Value Date/Time   COLORURINE RED* 01/13/2014  0925   APPEARANCEUR TURBID* 01/13/2014 0925   LABSPEC 1.037* 01/13/2014 0925   PHURINE 5.5 01/13/2014 0925   GLUCOSEU NEGATIVE 01/13/2014 0925   HGBUR LARGE* 01/13/2014 0925   BILIRUBINUR SMALL* 01/13/2014 0925   KETONESUR NEGATIVE 01/13/2014 0925   PROTEINUR >300* 01/13/2014 0925   UROBILINOGEN 1.0 01/13/2014 0925   NITRITE NEGATIVE 01/13/2014 0925   LEUKOCYTESUR SMALL* 01/13/2014 0925      ELIGIBLE FOR AVAILABLE RESEARCH PROTOCOL: no  STUDIES: Mm Digital Diagnostic Unilat L  05/30/2015  CLINICAL DATA:  Status post ultrasound-guided left breast biopsy EXAM: DIAGNOSTIC LEFT MAMMOGRAM POST ULTRASOUND BIOPSY COMPARISON:  Previous exam(s). FINDINGS: Mammographic images were obtained following ultrasound guided biopsy of a suspicious left breast mass at 5 o'clock. Post biopsy mammogram demonstrates the ribbon shaped biopsy marker to be in the expected location within the posterior, inferior left breast. IMPRESSION: Appropriate marker position post ultrasound-guided left breast biopsy. Final Assessment: Post Procedure Mammograms for Marker Placement Electronically Signed   By: Pamelia Hoit M.D.   On: 05/30/2015 16:10   US Breast Ltd Uni Left Inc Axilla  05/20/2015  CLINICAL DATA:  Recall from screening mammography, possible mass in the retroareolar left breast, far posterior depth, and possible mass or focal asymmetry in the retroareolar right breast, far posterior depth, visualized only on the MLO projection. Of note, the patient's sister was diagnosed with breast cancer 2 years ago in her early 60s. EXAM: 2D DIGITAL DIAGNOSTIC BILATERAL MAMMOGRAM WITH CAD AND ADJUNCT TOMO ULTRASOUND LEFT BREAST COMPARISON:  Mammography 05/12/2015, 05/06/2014 and earlier. No prior left breast ultrasound. ACR Breast Density Category b: There are scattered areas of fibroglandular density. FINDINGS: Standard 2D and tomosynthesis spot compression view of the area concern in the far posterior right breast in the MLO  projection and a standard 2D and tomosynthesis full field mediolateral view of the right breast were obtained. The area concern on the screening mammogram is a 6-7 mm normal-appearing intramammary lymph node with a fatty hilus. No suspicious mass, architectural distortion or suspicious calcifications is identified. Standard 2D and tomosynthesis spot compression views of the area of concern in the far posterior left breast were obtained. These confirm a mass with irregular margins that measures approximately 13 mm. There are no associated suspicious calcifications. Mammographic images were processed with CAD. On physical exam, there is a vague palpable mass deep in the lower outer quadrant of the left breast. Targeted left breast ultrasound is performed, showing a hypoechoic mass with vague, irregular margins and peripheral power Doppler flow deep at the 5 o'clock position approximately 3 cm from the nipple measuring approximately 1.6  x 1.2 x 0.6 cm. Sonographic evaluation of the left axilla demonstrates no pathologic lymphadenopathy. IMPRESSION: 1. Suspicious approximate 1.6 cm mass in the deep lower outer quadrant of the left breast. 2. No pathologic left axillary lymphadenopathy. 3. Normal-appearing intramammary lymph node accounting for the area of concern in the deep right breast on screening mammography. 4. No mammographic or sonographic evidence of malignancy, right breast. RECOMMENDATION: Ultrasound-guided core needle biopsy of the suspicious left breast mass. The ultrasound biopsy procedure was discussed with the patient and her questions were answered. She has agreed to proceed and at her request, this has been scheduled for April 24 at 3 o'clock p.m. I have discussed the findings and recommendations with the patient. Results were also provided in writing at the conclusion of the visit. If applicable, a reminder letter will be sent to the patient regarding the next appointment. BI-RADS CATEGORY  Category  4C: High suspicion for malignancy. Electronically Signed   By: Evangeline Dakin M.D.   On: 05/20/2015 09:44   Mm Diag Breast Tomo Bilateral  05/20/2015  CLINICAL DATA:  Recall from screening mammography, possible mass in the retroareolar left breast, far posterior depth, and possible mass or focal asymmetry in the retroareolar right breast, far posterior depth, visualized only on the MLO projection. Of note, the patient's sister was diagnosed with breast cancer 2 years ago in her early 68s. EXAM: 2D DIGITAL DIAGNOSTIC BILATERAL MAMMOGRAM WITH CAD AND ADJUNCT TOMO ULTRASOUND LEFT BREAST COMPARISON:  Mammography 05/12/2015, 05/06/2014 and earlier. No prior left breast ultrasound. ACR Breast Density Category b: There are scattered areas of fibroglandular density. FINDINGS: Standard 2D and tomosynthesis spot compression view of the area concern in the far posterior right breast in the MLO projection and a standard 2D and tomosynthesis full field mediolateral view of the right breast were obtained. The area concern on the screening mammogram is a 6-7 mm normal-appearing intramammary lymph node with a fatty hilus. No suspicious mass, architectural distortion or suspicious calcifications is identified. Standard 2D and tomosynthesis spot compression views of the area of concern in the far posterior left breast were obtained. These confirm a mass with irregular margins that measures approximately 13 mm. There are no associated suspicious calcifications. Mammographic images were processed with CAD. On physical exam, there is a vague palpable mass deep in the lower outer quadrant of the left breast. Targeted left breast ultrasound is performed, showing a hypoechoic mass with vague, irregular margins and peripheral power Doppler flow deep at the 5 o'clock position approximately 3 cm from the nipple measuring approximately 1.6 x 1.2 x 0.6 cm. Sonographic evaluation of the left axilla demonstrates no pathologic  lymphadenopathy. IMPRESSION: 1. Suspicious approximate 1.6 cm mass in the deep lower outer quadrant of the left breast. 2. No pathologic left axillary lymphadenopathy. 3. Normal-appearing intramammary lymph node accounting for the area of concern in the deep right breast on screening mammography. 4. No mammographic or sonographic evidence of malignancy, right breast. RECOMMENDATION: Ultrasound-guided core needle biopsy of the suspicious left breast mass. The ultrasound biopsy procedure was discussed with the patient and her questions were answered. She has agreed to proceed and at her request, this has been scheduled for April 24 at 3 o'clock p.m. I have discussed the findings and recommendations with the patient. Results were also provided in writing at the conclusion of the visit. If applicable, a reminder letter will be sent to the patient regarding the next appointment. BI-RADS CATEGORY  Category 4C: High suspicion for malignancy. Electronically Signed  By: Evangeline Dakin M.D.   On: 05/20/2015 09:44   [place] Lt Breast Bx W Loc Dev 1st Lesion Img Bx Spec US Guide  06/01/2015  ADDENDUM REPORT: 05/31/2015 14:13 ADDENDUM: Pathology revealed GRADE II INVASIVE DUCTAL CARCINOMA of the Left breast at the 5:00 o'clock location. This was found to be concordant by Dr. Pamelia Hoit. Pathology results were discussed with the patient by telephone. The patient reported doing well after the biopsy with swelling and tenderness at the site. Post biopsy instructions and care were reviewed and questions were answered. The patient was encouraged to call [facility] for any additional concerns. The patient was referred to [facility] on Jun 15, 2015. Pathology results reported by Terie Purser, RN on 05/31/2015. Electronically Signed   By: Pamelia Hoit M.D.   On: 05/31/2015 14:13  06/01/2015  CLINICAL DATA:  70 year old female with  suspicious left breast mass EXAM: ULTRASOUND GUIDED LEFT BREAST CORE NEEDLE BIOPSY COMPARISON:  Previous exam(s). FINDINGS: I met with the patient and we discussed the procedure of ultrasound-guided biopsy, including benefits and alternatives. We discussed the high likelihood of a successful procedure. We discussed the risks of the procedure, including infection, bleeding, tissue injury, clip migration, and inadequate sampling. Informed written consent was given. The usual time-out protocol was performed immediately prior to the procedure. Using sterile technique and 2% Lidocaine as local anesthetic, under direct ultrasound visualization, a 12 gauge spring-loaded device was used to perform biopsy of a suspicious left breast mass at 5 o'clock, 3 cm from the nipple using a lateral to medial approach. At the conclusion of the procedure a ribbon shaped tissue marker clip was deployed into the biopsy cavity. Follow up 2 view mammogram was performed and dictated separately. IMPRESSION: Ultrasound guided biopsy of a left breast mass at 5 o'clock. Electronically Signed: By: Pamelia Hoit M.D. On: 05/30/2015 16:09    ASSESSMENT: 70 y.o. [place] woman status post left breast lower outer quadrant 05/20/2015 for a clinicalT1c N0, stage IA invasive ductal carcinoma, grade 2, strongly estrogen and progesterone receptor positive, HER-2 nonamplified, with an MIB-1 of 25%  (1) breast conserving surgery with sentinel lymph node sampling pending  (2) Oncotype to be obtained from the definitive surgical specimen  (3) adjuvant radiation to follow as appropriate  (4) adjuvant anti-estrogens to follow local treatment  (5) genetics testing pending  PLAN: We spent the better part of today's hour-long appointment discussing the biology of breast cancer in general, and the specifics of the patient's tumor in particular. Halynn understands breast cancer is not one disease but more than 100 different diseases each of which is  treated differently. Keeping that in mind will be helpful avoiding confusion when she gets information from friends and the Internet.  We discussed the difference between local and systemic therapy. In terms of local treatment we recommend lumpectomy plus radiation. This is equivalent to mastectomy in terms of survival.  In terms of systemic therapy, she is a good candidate for anti-estrogen details, which she will take for a minimum of 5 years. She is not a candidate for anti-HER-2 treatments. The question of chemotherapy is more complicated. Chemotherapy can since significantly reduce the risk of recurrence in some cancers and very little and others.  She understands she has a stage I estrogen receptor positive tumor and that some of those tumors can be aggressive and others indolent. To help [place] sort that out we  request an Oncotype from the final pathology specimen and that helps US guide the chemotherapy decision.   It takes 2 weeks to get those results and there will be some delay in her surgery since she has significant swelling from her recent biopsy. Accordingly I will see her late June. We should have all the data available by then and should be able to make a definitive decision regarding chemotherapy.  She tells me she had a CT scan of the chest March 2016 which showed some "spots in my lungs". I have checked on that and they have suggested repeat one year later. I have gone ahead and ordered that for her. We will call her with those results.  Otherwise, given her history of pulmonary embolus shortly after going on estrogen replacement, I will suggest anastrozole as her anti-estrogen once  She completes her local treatment.  Tamyka has a good understanding of the overall plan. She agrees with it. She knows the goal of treatment in her case is cure. She will call with any problems that may develop before her next visit here.  Chauncey Cruel, MD   06/15/2015 3:52 PM Medical Oncology and  Hematology [facility] [address] [place], [place] [address] Tel. [phone]    Fax. [phone]

## 2015-06-15 NOTE — Patient Instructions (Signed)

## 2015-06-15 NOTE — Progress Notes (Signed)
Radiation Oncology         [phone] ________________________________  Initial outpatient Consultation  Name: Stacey Barrett MRN: [id number]  Date: 06/15/2015  DOB: 09-07-1945  KZ:SWFUXNA,TFTDDUKG R., MD  Fanny Skates, MD   REFERRING PHYSICIAN: Fanny Skates, MD  DIAGNOSIS:    ICD-9-CM ICD-10-CM   1. 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 female who presented with screening mammography revealing right breast asymmetry and a left breast mass at 5:00.  Further workup determined the right breast asymmetry to be consistent with a benign intramammary node.  The left breast lesion was 1.6 cm on [place] with a negative axilla on [place]. Left core needle breast biopsy showed invasive ductal carcinoma with characteristics as described above in the diagnosis.  She has had previous skin cancers surgically removed from her face.  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 In her [employer].    PREVIOUS RADIATION THERAPY: No  PAST MEDICAL HISTORY:  has a past medical history of Breast cancer of lower-outer quadrant of left female breast ([place]) (06/01/2015); Breast cancer ([place]); Skin cancer; and Hyperlipidemia.    PAST SURGICAL HISTORY: Past Surgical History  Procedure Laterality Date  . Cholecystectomy    . Abdominal hysterectomy    . Facial cosmetic surgery      FAMILY HISTORY: family history includes Breast cancer in her mother; Colon cancer in her maternal uncle.  SOCIAL HISTORY:  reports that she has quit smoking. She does not have any smokeless tobacco history on file. She reports that she drinks alcohol. She reports that she does not use illicit drugs.  ALLERGIES: Review of patient's allergies indicates no known allergies.  MEDICATIONS:  No current outpatient prescriptions on file.   No current  facility-administered medications for this encounter.    REVIEW OF SYSTEMS:  Notable for that above.   PHYSICAL EXAM:    Vitals with BMI 06/15/2015  Height '5\' 10"'$   Weight 163 lbs 3 oz  BMI 25.4  Systolic 270  Diastolic 83  Pulse 96  Respirations 18   General: Alert and oriented, in no acute distress HEENT: Head is normocephalic. Extraocular movements are intact. Oropharynx is clear. Neck: Neck is supple, no palpable cervical or supraclavicular lymphadenopathy. Heart: Regular in rate and rhythm with no murmurs, rubs, or gallops. Chest: Clear to auscultation bilaterally, with no rhonchi, wheezes, or rales. Abdomen: Soft, nontender, nondistended, with no rigidity or guarding. Extremities: No cyanosis or edema. Lymphatics: see Neck Exam Skin: No concerning lesions. Musculoskeletal: symmetric strength and muscle tone throughout. Neurologic: Cranial nerves II through XII are grossly intact. No obvious focalities. Speech is fluent. Coordination is intact. Psychiatric: Judgment and insight are intact. Affect is appropriate. Breasts: Significant swelling/bruising of left breast from biopsy with tenderness.  No other palpable masses appreciated in the breasts or axillae.   ECOG = 0  0 - Asymptomatic (Fully active, able to carry on all predisease activities without restriction)  1 - Symptomatic but completely ambulatory (Restricted in physically strenuous activity but ambulatory and able to carry out work of a light or sedentary nature. For example, light housework, office work)  2 - Symptomatic, <50% in bed during the day (Ambulatory and capable of all self care but unable to carry out any work activities. Up and about more than 50% of waking hours)  3 - Symptomatic, >50% in bed, but not bedbound (Capable of only limited self-care, confined to bed or chair 50% or more of waking hours)  4 - Bedbound (Completely disabled. Cannot carry on any self-care. Totally confined to bed or  chair)  5 - Death   Eustace Pen MM, Creech RH, Tormey DC, et al. [phone]). "Toxicity and response criteria of the [facility]". [place] Oncol. 5 (6): 649-55   LABORATORY DATA:  Lab Results  Component Value Date   WBC 10.1 06/15/2015   HGB 14.9 06/15/2015   HCT 44.7 06/15/2015   MCV 88.9 06/15/2015   PLT 315 06/15/2015   CMP     Component Value Date/Time   NA 139 06/15/2015 1211   NA 134* 01/13/2014 0956   K 4.2 06/15/2015 1211   K 4.3 01/13/2014 0956   CL 97 01/13/2014 0956   CO2 25 06/15/2015 1211   CO2 21 01/13/2014 0956   GLUCOSE 83 06/15/2015 1211   GLUCOSE 113* 01/13/2014 0956   BUN 20.8 06/15/2015 1211   BUN 26* 01/13/2014 0956   CREATININE 0.8 06/15/2015 1211   CREATININE 0.87 01/13/2014 0956   CALCIUM 10.1 06/15/2015 1211   CALCIUM 10.1 01/13/2014 0956   PROT 7.9 06/15/2015 1211   ALBUMIN 4.4 06/15/2015 1211   AST 15 06/15/2015 1211   ALT 16 06/15/2015 1211   ALKPHOS 80 06/15/2015 1211   BILITOT 0.58 06/15/2015 1211   GFRNONAA 66* 01/13/2014 0956   GFRAA 76* 01/13/2014 0956         RADIOGRAPHY: Mm Digital Diagnostic Unilat L  05/30/2015  CLINICAL DATA:  Status post ultrasound-guided left breast biopsy EXAM: DIAGNOSTIC LEFT MAMMOGRAM POST ULTRASOUND BIOPSY COMPARISON:  Previous exam(s). FINDINGS: Mammographic images were obtained following ultrasound guided biopsy of a suspicious left breast mass at 5 o'clock. Post biopsy mammogram demonstrates the ribbon shaped biopsy marker to be in the expected location within the posterior, inferior left breast. IMPRESSION: Appropriate marker position post ultrasound-guided left breast biopsy. Final Assessment: Post Procedure Mammograms for Marker Placement Electronically Signed   By: Pamelia Hoit M.D.   On: 05/30/2015 16:10   US Breast Ltd Uni Left Inc Axilla  05/20/2015  CLINICAL DATA:  Recall from screening mammography, possible mass in the retroareolar left breast, far posterior depth, and possible  mass or focal asymmetry in the retroareolar right breast, far posterior depth, visualized only on the MLO projection. Of note, the patient's sister was diagnosed with breast cancer 2 years ago in her early 24s. EXAM: 2D DIGITAL DIAGNOSTIC BILATERAL MAMMOGRAM WITH CAD AND ADJUNCT TOMO ULTRASOUND LEFT BREAST COMPARISON:  Mammography 05/12/2015, 05/06/2014 and earlier. No prior left breast ultrasound. ACR Breast Density Category b: There are scattered areas of fibroglandular density. FINDINGS: Standard 2D and tomosynthesis spot compression view of the area concern in the far posterior right breast in the MLO projection and a standard 2D and tomosynthesis full field mediolateral view of the right breast were obtained. The area concern on the screening mammogram is a 6-7 mm normal-appearing intramammary lymph node with a fatty hilus. No suspicious mass, architectural distortion or suspicious calcifications is identified. Standard 2D and tomosynthesis spot compression views of the area of concern in the far posterior left breast were obtained. These confirm a mass with irregular margins that measures approximately 13 mm. There are no associated suspicious calcifications. Mammographic images were processed with CAD. On physical exam, there is a vague palpable mass deep in the lower outer quadrant of the left  breast. Targeted left breast ultrasound is performed, showing a hypoechoic mass with vague, irregular margins and peripheral power Doppler flow deep at the 5 o'clock position approximately 3 cm from the nipple measuring approximately 1.6 x 1.2 x 0.6 cm. Sonographic evaluation of the left axilla demonstrates no pathologic lymphadenopathy. IMPRESSION: 1. Suspicious approximate 1.6 cm mass in the deep lower outer quadrant of the left breast. 2. No pathologic left axillary lymphadenopathy. 3. Normal-appearing intramammary lymph node accounting for the area of concern in the deep right breast on screening mammography. 4. No  mammographic or sonographic evidence of malignancy, right breast. RECOMMENDATION: Ultrasound-guided core needle biopsy of the suspicious left breast mass. The ultrasound biopsy procedure was discussed with the patient and her questions were answered. She has agreed to proceed and at her request, this has been scheduled for April 24 at 3 o'clock p.m. I have discussed the findings and recommendations with the patient. Results were also provided in writing at the conclusion of the visit. If applicable, a reminder letter will be sent to the patient regarding the next appointment. BI-RADS CATEGORY  Category 4C: High suspicion for malignancy. Electronically Signed   By: Evangeline Dakin M.D.   On: 05/20/2015 09:44   Mm Diag Breast Tomo Bilateral  05/20/2015  CLINICAL DATA:  Recall from screening mammography, possible mass in the retroareolar left breast, far posterior depth, and possible mass or focal asymmetry in the retroareolar right breast, far posterior depth, visualized only on the MLO projection. Of note, the patient's sister was diagnosed with breast cancer 2 years ago in her early 61s. EXAM: 2D DIGITAL DIAGNOSTIC BILATERAL MAMMOGRAM WITH CAD AND ADJUNCT TOMO ULTRASOUND LEFT BREAST COMPARISON:  Mammography 05/12/2015, 05/06/2014 and earlier. No prior left breast ultrasound. ACR Breast Density Category b: There are scattered areas of fibroglandular density. FINDINGS: Standard 2D and tomosynthesis spot compression view of the area concern in the far posterior right breast in the MLO projection and a standard 2D and tomosynthesis full field mediolateral view of the right breast were obtained. The area concern on the screening mammogram is a 6-7 mm normal-appearing intramammary lymph node with a fatty hilus. No suspicious mass, architectural distortion or suspicious calcifications is identified. Standard 2D and tomosynthesis spot compression views of the area of concern in the far posterior left breast were  obtained. These confirm a mass with irregular margins that measures approximately 13 mm. There are no associated suspicious calcifications. Mammographic images were processed with CAD. On physical exam, there is a vague palpable mass deep in the lower outer quadrant of the left breast. Targeted left breast ultrasound is performed, showing a hypoechoic mass with vague, irregular margins and peripheral power Doppler flow deep at the 5 o'clock position approximately 3 cm from the nipple measuring approximately 1.6 x 1.2 x 0.6 cm. Sonographic evaluation of the left axilla demonstrates no pathologic lymphadenopathy. IMPRESSION: 1. Suspicious approximate 1.6 cm mass in the deep lower outer quadrant of the left breast. 2. No pathologic left axillary lymphadenopathy. 3. Normal-appearing intramammary lymph node accounting for the area of concern in the deep right breast on screening mammography. 4. No mammographic or sonographic evidence of malignancy, right breast. RECOMMENDATION: Ultrasound-guided core needle biopsy of the suspicious left breast mass. The ultrasound biopsy procedure was discussed with the patient and her questions were answered. She has agreed to proceed and at her request, this has been scheduled for April 24 at 3 o'clock p.m. I have discussed the findings and recommendations with the patient. Results were also  provided in writing at the conclusion of the visit. If applicable, a reminder letter will be sent to the patient regarding the next appointment. BI-RADS CATEGORY  Category 4C: High suspicion for malignancy. Electronically Signed   By: Evangeline Dakin M.D.   On: 05/20/2015 09:44   [place] Lt Breast Bx W Loc Dev 1st Lesion Img Bx Spec US Guide  06/01/2015  ADDENDUM REPORT: 05/31/2015 14:13 ADDENDUM: Pathology revealed GRADE II INVASIVE DUCTAL CARCINOMA of the Left breast at the 5:00 o'clock location. This was found to be concordant by Dr. Pamelia Hoit. Pathology results were discussed with the patient by  telephone. The patient reported doing well after the biopsy with swelling and tenderness at the site. Post biopsy instructions and care were reviewed and questions were answered. The patient was encouraged to call [facility] for any additional concerns. The patient was referred to [facility] on Jun 15, 2015. Pathology results reported by Terie Purser, RN on 05/31/2015. Electronically Signed   By: Pamelia Hoit M.D.   On: 05/31/2015 14:13  06/01/2015  CLINICAL DATA:  70 year old female with suspicious left breast mass EXAM: ULTRASOUND GUIDED LEFT BREAST CORE NEEDLE BIOPSY COMPARISON:  Previous exam(s). FINDINGS: I met with the patient and we discussed the procedure of ultrasound-guided biopsy, including benefits and alternatives. We discussed the high likelihood of a successful procedure. We discussed the risks of the procedure, including infection, bleeding, tissue injury, clip migration, and inadequate sampling. Informed written consent was given. The usual time-out protocol was performed immediately prior to the procedure. Using sterile technique and 2% Lidocaine as local anesthetic, under direct ultrasound visualization, a 12 gauge spring-loaded device was used to perform biopsy of a suspicious left breast mass at 5 o'clock, 3 cm from the nipple using a lateral to medial approach. At the conclusion of the procedure a ribbon shaped tissue marker clip was deployed into the biopsy cavity. Follow up 2 view mammogram was performed and dictated separately. IMPRESSION: Ultrasound guided biopsy of a left breast mass at 5 o'clock. Electronically Signed: By: Pamelia Hoit M.D. On: 05/30/2015 16:09      IMPRESSION/PLAN: Stage T1cN0M0 left breast cancer, ER+  She has been discussed at our multidisciplinary tumor board.  The consensus is that she would be would a good candidate for breast conservation. I talked to her  about the option of a mastectomy and informed her that her expected overall survival would be equivalent between mastectomy and breast conservation, based upon randomized controlled data. She is enthusiastic about breast conservation.   Genetics recommended due to family history.  Oncotype will be ordered to determine if chemotherapy is warranted.  For the patient's early stage favorable risk breast cancer, we had a thorough discussion about her options for adjuvant therapy. One option would be antiestrogen therapy as discussed with medical oncology. She would take a pill for approximately 5 years. The alternative option (but less standard) would be radiotherapy to the breast. The most aggressive option would be to pursue both modalities.  Of note, I discussed the data from the [employer] al trial in the [place] [employer]. She understands that tamoxifen compared to radiation plus tamoxifen demonstrated no survival benefit among the women in this study. The women were 55 years or older with stage I estrogen receptor positive breast cancer. Based on this study, I told the patient that her overall life expectancy should not be affected by  adding radiotherapy to antiestrogen medication. She understands that the main benefit of  adding radiotherapy to anti estrogen therapy would be a very small but measurable local control benefit.  We discussed the fact that radiotherapy only provides a local control benefit while anti-estrogen pills provide systemic coverage. That being said, the risk of systemic failure is relatively low with her type of breast cancer.   Given that her lesion is at the upper limit of T1 disease, and that she could live for a couple decades of more, I think that she stands to benefit more from radiotherapy than the average patient treated on this study.  I recommended radiotherapy to her today. Again, the benefit of RT would be for local control, not overall survival.  We  discussed the risks benefits and side effects of radiotherapy. She understands that the side effects would likely include some skin irritation and fatigue during the weeks of radiation. There is a risk of late effects which include but are not necessarily limited to cosmetic changes and rare lung or heart toxicity. I would anticipate delivering approximately 4-6 weeks of radiotherapy.  I will see her back after surgery.   __________________________________________   Eppie Gibson, MD

## 2015-06-15 NOTE — Therapy (Signed)
[place], [place], [address] Phone: [phone]   Fax:  [phone]  Physical Therapy Evaluation  Patient Details  Name: Stacey Barrett MRN: [id number] Date of Birth: Aug 09, 1945 Referring Provider: Dr. Fanny Skates  Encounter Date: 06/15/2015      PT End of Session - 06/15/15 2206    Visit Number 1   Number of Visits 1   PT Start Time 1440   PT Stop Time 1507   PT Time Calculation (min) 27 min   Activity Tolerance Patient tolerated treatment well   Behavior During Therapy [facility] for tasks assessed/performed      Past Medical History  Diagnosis Date  . Breast cancer of lower-outer quadrant of left female breast ([place]) 06/01/2015  . Breast cancer ([place])   . Skin cancer     05/2013  . Hyperlipidemia     Past Surgical History  Procedure Laterality Date  . Cholecystectomy    . Abdominal hysterectomy    . Facial cosmetic surgery      There were no vitals filed for this visit.       Subjective Assessment - 06/15/15 2207    Subjective Patient reports she is here to be seen by a medical team for her newly diagnosed left breast cancer.   Patient is accompained by: Family member   Pertinent History Patient was diagnosed on 05/12/15 with left grade 2 invasive ductal carcinoma. It measures 1.6 cm in the lower outer quadrant, is ER/PR positive and HER2 negative.     Patient Stated Goals Learn post op shoulder ROM HEP and reduce lymphedema risk            [facility] PT Assessment - 06/15/15 0001    Assessment   Medical Diagnosis Left breast cancer   Referring Provider Dr. Fanny Skates   Onset Date/Surgical Date 05/12/15   Hand Dominance Right   Prior Therapy none   Precautions   Precautions Other (comment)   Precaution Comments Active cancer   Restrictions   Weight Bearing Restrictions No   Balance Screen   Has the patient fallen in the past 6 months No   Has the patient had a decrease in  activity level because of a fear of falling?  No   Is the patient reluctant to leave their home because of a fear of falling?  No   Home Environment   Living Environment Private residence   Living Arrangements Alone   Available Help at Discharge Friend(s)   Prior Function   Level of Independence Independent   Vocation Full time employment   Vocation Requirements Owner of [employer] She exercises with a trainer once a week and does weights 2-3x/week   Cognition   Overall Cognitive Status Within Functional Limits for tasks assessed   Posture/Postural Control   Posture/Postural Control Postural limitations   Postural Limitations Rounded Shoulders;Forward head   ROM / Strength   AROM / PROM / Strength Strength;AROM   AROM   AROM Assessment Site Shoulder;Cervical   Right/Left Shoulder Right;Left   Right Shoulder Extension 50 Degrees   Right Shoulder Flexion 145 Degrees   Right Shoulder ABduction 147 Degrees   Right Shoulder Internal Rotation 63 Degrees   Right Shoulder External Rotation 65 Degrees   Left Shoulder Extension 56 Degrees   Left Shoulder Flexion 134 Degrees   Left Shoulder ABduction 142 Degrees   Left Shoulder Internal Rotation 74 Degrees   Left Shoulder External Rotation 69  Degrees   Cervical Flexion 25% limited   Cervical Extension 25% limited   Cervical - Right Side Bend 50% limited   Cervical - Left Side Bend 50% limited   Cervical - Right Rotation 50% limited   Cervical - Left Rotation 50% limited   Strength   Overall Strength Within functional limits for tasks performed           LYMPHEDEMA/ONCOLOGY QUESTIONNAIRE - 06/15/15 2204    Type   Cancer Type Left breast cancer   Lymphedema Assessments   Lymphedema Assessments Upper extremities   Right Upper Extremity Lymphedema   10 cm Proximal to Olecranon Process 23.8 cm   Olecranon Process 24 cm   10 cm Proximal to Ulnar Styloid Process 20.4 cm   Just Proximal to Ulnar Styloid Process 15.2  cm   Across Hand at [employer] 19.3 cm   At [place] of 2nd Digit 6.8 cm   Left Upper Extremity Lymphedema   10 cm Proximal to Olecranon Process 25 cm   Olecranon Process 24.4 cm   10 cm Proximal to Ulnar Styloid Process 19.8 cm   Just Proximal to Ulnar Styloid Process 15.1 cm   Across Hand at [employer] 19.6 cm   At [place] of 2nd Digit 6.3 cm       Patient was instructed today in a home exercise program today for post op shoulder range of motion. These included active assist shoulder flexion in sitting, scapular retraction, wall walking with shoulder abduction, and hands behind head external rotation.  She was encouraged to do these twice a day, holding 3 seconds and repeating 5 times when permitted by her physician.         PT Education - 06/15/15 2206    Education provided Yes   Education Details Lymphedema risk reduction   Person(s) Educated Patient   Methods Explanation;Demonstration;Handout   Comprehension Returned demonstration;Verbalized understanding              Breast Clinic Goals - 06/15/15 2214    Patient will be able to verbalize understanding of pertinent lymphedema risk reduction practices relevant to her diagnosis specifically related to skin care.   Time 1   Period Days   Status Achieved   Patient will be able to return demonstrate and/or verbalize understanding of the post-op home exercise program related to regaining shoulder range of motion.   Time 1   Period Days   Status Achieved   Patient will be able to verbalize understanding of the importance of attending the postoperative After Breast Cancer Class for further lymphedema risk reduction education and therapeutic exercise.   Time 1   Period Days   Status Achieved              Plan - 06/15/15 2208    Clinical Impression Statement Patient was diagnosed on 05/12/15 with left grade 2 invasive ductal carcinoma. It measures 1.6 cm in the lower outer quadrant, is ER/PR positive and HER2  negative.  Her multidisciplinary medical team met to determine a recommended treatment plan.  She is planning to have a left lumpectomy and sentinel node biopsy followed by Oncotype testing, radiation and anti-estrogen therapy.  She may benefit from post op PT to regain shoulder ROM and reduce lymphedema risk.  Due to her lack of comorbidities and what appears to be a good support system, she is a low level complexity evaluation.   Rehab Potential Excellent   Clinical Impairments Affecting Rehab Potential none  PT Frequency One time visit   PT Treatment/Interventions Therapeutic exercise;Patient/family education   PT Next Visit Plan Will f/u after surgery   PT Home Exercise Plan Pos op shoulder ROM HEP   Consulted and Agree with Plan of Care Patient;Family member/caregiver   Family Member Consulted daughter-in-law and friend      Patient will benefit from skilled therapeutic intervention in order to improve the following deficits and impairments:  Decreased strength, Decreased knowledge of precautions, Pain, Impaired UE functional use, Decreased range of motion  Visit Diagnosis: Carcinoma of lower outer quadrant of left breast ([place]) - Plan: PT plan of care cert/re-cert  Abnormal posture - Plan: PT plan of care cert/re-cert  Patient will follow up at outpatient cancer rehab if needed following surgery.  If the patient requires physical therapy at that time, a specific plan will be dictated and sent to the referring physician for approval. The patient was educated today on appropriate basic range of motion exercises to begin post operatively and the importance of attending the After Breast Cancer class following surgery.  Patient was educated today on lymphedema risk reduction practices as it pertains to recommendations that will benefit the patient immediately following surgery.  She verbalized good understanding.  No additional physical therapy is indicated at this time.        G-Codes -  06/20/2015 2212-04-17    Functional Assessment Tool Used Clinical Judgement   Functional Limitation Other PT primary   Other PT Primary Current Status (K0254) At least 1 percent but less than 20 percent impaired, limited or restricted   Other PT Primary Goal Status (Y7062) At least 1 percent but less than 20 percent impaired, limited or restricted   Other PT Primary Discharge Status (B7628) At least 1 percent but less than 20 percent impaired, limited or restricted       Problem List Patient Active Problem List   Diagnosis Date Noted  . Breast cancer of lower-outer quadrant of left female breast ([place]) 06/01/2015    Annia Friendly, PT 20-Jun-2015 10:19 PM   [place], [place], [address] Phone: [phone]   Fax:  [phone]  Name: Stacey Barrett MRN: [id number] Date of Birth: 1945/06/08

## 2015-06-16 ENCOUNTER — Encounter: Payer: Self-pay | Admitting: General Practice

## 2015-06-16 NOTE — Progress Notes (Signed)
[place] Psychosocial Distress Screening Spiritual Care  Met with Terrilee, friend Sula Soda, and DIL Verdis Frederickson at [facility] to introduce [place] team/resources, reviewing distress screen per protocol.  The patient scored a 1 on the Psychosocial Distress Thermometer which indicates mild distress. Also assessed for distress and other psychosocial needs.    ONCBCN DISTRESS SCREENING 06/16/2015  Screening Type Initial Screening  Distress experienced in past week (1-10) 1  Practical problem type Work/school  Information Concerns Type Lack of info about diagnosis;Lack of info about treatment;Lack of info about complementary therapy choices;Lack of info about maintaining fitness  Referral to support programs Yes  Other Spiritual Care   Pt states that she views dx as "just a bump in the road," noting that she has been through much more distressing times.  She reports no other needs and is aware of ongoing [place] team/programming resources, should needs/desire arise.  Please page if circumstances change.  Thank you.  Follow up needed: No.   Chaplain Lorrin Jackson, [place], [facility] Pager [phone] Voicemail [phone]

## 2015-06-17 ENCOUNTER — Other Ambulatory Visit: Payer: Self-pay | Admitting: General Surgery

## 2015-06-17 DIAGNOSIS — C50512 Malignant neoplasm of lower-outer quadrant of left female breast: Secondary | ICD-10-CM

## 2015-06-20 ENCOUNTER — Telehealth: Payer: Self-pay | Admitting: *Deleted

## 2015-06-20 NOTE — Telephone Encounter (Signed)
Spoke to pt concerning [place] from 06/15/15. Denies questions or concerns regarding dx or treatment care plan. Encourage pt to call with needs. Received verbal understanding.

## 2015-06-23 ENCOUNTER — Encounter (HOSPITAL_COMMUNITY): Payer: Self-pay

## 2015-06-23 ENCOUNTER — Ambulatory Visit (HOSPITAL_COMMUNITY)
Admission: RE | Admit: 2015-06-23 | Discharge: 2015-06-23 | Disposition: A | Discharge status: Home / Self Care | Payer: Medicare Other | Source: Ambulatory Visit | Attending: Oncology | Admitting: Oncology

## 2015-06-23 DIAGNOSIS — R918 Other nonspecific abnormal finding of lung field: Secondary | ICD-10-CM | POA: Insufficient documentation

## 2015-06-23 DIAGNOSIS — C50512 Malignant neoplasm of lower-outer quadrant of left female breast: Secondary | ICD-10-CM | POA: Insufficient documentation

## 2015-06-23 MED ORDER — IOPAMIDOL (ISOVUE-300) INJECTION 61%
75.0000 mL | Freq: Once | INTRAVENOUS | Status: AC | PRN
  Administered 2015-06-23: 75 mL via INTRAVENOUS

## 2015-07-05 NOTE — Pre-Procedure Instructions (Signed)
    [name]  07/05/2015      [facility] - [place], [place] [address] AT [facility] [address] [place] [address] Phone: [phone] Fax: [phone]    Your procedure is scheduled on 07-11-2015  Monday    Report to [facility] Admitting at 8:30 A.M.   Call this number if you have problems the morning of surgery:  [phone]   Remember:  Do not eat food or drink liquids after midnight.   Take these medicines the morning of surgery with A SIP OF WATER  Sertraline(Zoloft)              Stop all aspirin,ibuprofen,aleve,motrin,Goody's powders,vitamins and herbal supplements 5 days prior to surgery   Do not wear jewelry, make-up or nail polish.  Do not wear lotions, powders, or perfumes.  You may NOT wear deodorant.  Do not shave 48 hours prior to surgery.    Do not bring valuables to the hospital.  [facility] is not responsible for any belongings or valuables.  Contacts, dentures or bridgework may not be worn into surgery.  Leave your suitcase in the car.  After surgery it may be brought to your room.  For patients admitted to the hospital, discharge time will be determined by your treatment team.  Patients discharged the day of surgery will not be allowed to drive home.    Special instructions:  Se  Attached Sheet for instructions on CHG showers  Please read over the following fact sheets that you were given. Coughing and Deep Breathing and Surgical Site Infection Prevention

## 2015-07-06 ENCOUNTER — Encounter (HOSPITAL_COMMUNITY)
Admission: RE | Admit: 2015-07-06 | Discharge: 2015-07-06 | Disposition: A | Discharge status: Home / Self Care | Payer: Medicare Other | Source: Ambulatory Visit | Attending: General Surgery | Admitting: General Surgery

## 2015-07-06 ENCOUNTER — Encounter (HOSPITAL_COMMUNITY): Payer: Self-pay

## 2015-07-06 DIAGNOSIS — Z01812 Encounter for preprocedural laboratory examination: Secondary | ICD-10-CM | POA: Insufficient documentation

## 2015-07-06 DIAGNOSIS — C50912 Malignant neoplasm of unspecified site of left female breast: Secondary | ICD-10-CM | POA: Insufficient documentation

## 2015-07-06 HISTORY — DX: Major depressive disorder, single episode, unspecified: F32.9

## 2015-07-06 HISTORY — DX: Unspecified osteoarthritis, unspecified site: M19.90

## 2015-07-06 HISTORY — DX: Depression, unspecified: F32.A

## 2015-07-06 LAB — COMPREHENSIVE METABOLIC PANEL
ALK PHOS: 70 U/L (ref 38–126)
ALT: 15 U/L (ref 14–54)
AST: 21 U/L (ref 15–41)
Albumin: 4.1 g/dL (ref 3.5–5.0)
Anion gap: 9 (ref 5–15)
BILIRUBIN TOTAL: 0.5 mg/dL (ref 0.3–1.2)
BUN: 13 mg/dL (ref 6–20)
CO2: 24 mmol/L (ref 22–32)
CREATININE: 0.72 mg/dL (ref 0.44–1.00)
Calcium: 9.8 mg/dL (ref 8.9–10.3)
Chloride: 106 mmol/L (ref 101–111)
GFR calc Af Amer: >60 mL/min (ref >60)
Glucose, Bld: 82 mg/dL (ref 65–99)
Potassium: 4 mmol/L (ref 3.5–5.1)
Sodium: 139 mmol/L (ref 135–145)
TOTAL PROTEIN: 6.9 g/dL (ref 6.5–8.1)

## 2015-07-06 LAB — CBC WITH DIFFERENTIAL/PLATELET
BASOS ABS: 0.1 10*3/uL (ref 0.0–0.1)
Basophils Relative: 1 %
Eosinophils Absolute: 0.4 10*3/uL (ref 0.0–0.7)
Eosinophils Relative: 6 %
HEMATOCRIT: 42.3 % (ref 36.0–46.0)
HEMOGLOBIN: 14.1 g/dL (ref 12.0–15.0)
LYMPHS PCT: 39 %
Lymphs Abs: 2.7 10*3/uL (ref 0.7–4.0)
MCH: 29.7 pg (ref 26.0–34.0)
MCHC: 33.3 g/dL (ref 30.0–36.0)
MCV: 89.1 fL (ref 78.0–100.0)
MONO ABS: 0.7 10*3/uL (ref 0.1–1.0)
Monocytes Relative: 9 %
NEUTROS ABS: 3.2 10*3/uL (ref 1.7–7.7)
Neutrophils Relative %: 45 %
Platelets: 239 10*3/uL (ref 150–400)
RBC: 4.75 MIL/uL (ref 3.87–5.11)
RDW: 13.6 % (ref 11.5–15.5)
WBC: 7 10*3/uL (ref 4.0–10.5)

## 2015-07-08 ENCOUNTER — Ambulatory Visit
Admission: RE | Admit: 2015-07-08 | Discharge: 2015-07-08 | Disposition: A | Discharge status: Home / Self Care | Payer: Medicare Other | Source: Ambulatory Visit | Attending: General Surgery | Admitting: General Surgery

## 2015-07-08 DIAGNOSIS — C50512 Malignant neoplasm of lower-outer quadrant of left female breast: Secondary | ICD-10-CM

## 2015-07-08 NOTE — H&P (Signed)
Stacey Barrett. Kines  Location: [facility] Patient #: [id number] DOB: [birth year] Undefined / Language: Stacey Barrett / Race: White Female       History of Present Illness  The patient is a 70 year old female who presents with breast cancer. This is a pleasant 70 year old Caucasian female, referred by Dr. Pamelia Hoit at the [facility] for evaluation and management of a new diagnosis of invasive carcinoma left breast, lower outer quadrant. Stacey Barrett is her PCP. She is being evaluated in the [facility] today by Dr. Jana Hakim, Dr. Isidore Moos, and me. Her case was also discussed at breast conference this morning.  She has no prior history of breast problems. Routine screening mammogram showed a suspicious mass in the left breast, lower outer quadrant. 1.6 cm in diameter at 5 o'clock position. This is in the deep central breast. In the right breast they found a normal intramammary lymph node and no further evaluation was felt to be necessary. Ultrasound of the left axilla was negative.  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 Clinical stage T1c, N0.  Comorbidities include past history of TAH and BSO when she developed a postop pulmonary embolism many years ago. Laparoscopic cholecystectomy. Facelift. Cataracts. She takes no medicines. Family history reveals that her sister had breast cancer age 55 and survived. Her sister also had multiple myeloma. Mother developed breast cancer at age 57 and survived and lived to age 106. Socially she is a widow. Lives alone. Lives in [place]. She is independent. 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 she is very busy.  We talked a long time about the approaches and options for surgical management of breast cancer. We discussed the options of total mastectomies, sentinel lymph node biopsy with or  without reconstruction. We talked about alternative" option of lumpectomy and sentinel lymph node biopsy and postop radiation therapy. She wants to have a lumpectomy and breast conservation. She is referred for genetic counseling and genetic testing because of her family history. Dr. Jana Hakim requests Oncotype testing on the tumor  I told her we would wait about 3 weeks because of her large hematoma postbiopsy. She'll be scheduled for left breast lumpectomy with radioactive seed localization, injection of blue dye, and left axillary sentinel lymph node biopsy. I discussed the indications, details, techniques, and numerous risk of the surgery with her. She is aware of the risk of bleeding, infection, nerve damage with chronic pain, cosmetic deformity, reoperation for positive margins or positive nodes, arm swelling, arm numbness. She understands all of these issues. All of her questions are answered. She agrees with this plan.  We will ask our office to call her tomorrow to begin the scheduling process.    Other Problems  Arthritis Back Pain Breast Cancer Hypercholesterolemia Kidney Stone Pulmonary Embolism / Blood Clot in Legs  Past Surgical History Breast Biopsy Left. Cataract Surgery Bilateral. Gallbladder Surgery - Laparoscopic Hysterectomy (not due to cancer) - Complete Tonsillectomy  Diagnostic Studies History  Colonoscopy within last year Mammogram within last year Pap Smear 1-5 years ago  Medication History Medications Reconciled  Social History  Alcohol use Occasional alcohol use. Caffeine use Carbonated beverages, Coffee. No drug use Tobacco use Former smoker.  Family History Alcohol Abuse Father, Sister. Arthritis Mother. Breast Cancer Sister. Hypertension Father, Mother. Seizure disorder Sister.  Pregnancy / Birth History Age at menarche 51 years. Age of menopause <43 Gravida 2  Maternal age 25-20 Para 3 Regular  periods    Review of Systems  General Not Present- Appetite Loss, Chills, Fatigue, Fever, Night Sweats, Weight Gain and Weight Loss. Skin Not Present- Change in Wart/Mole, Dryness, Hives, Jaundice, New Lesions, Non-Healing Wounds, Rash and Ulcer. HEENT Not Present- Earache, Hearing Loss, Hoarseness, Nose Bleed, Oral Ulcers, Ringing in the Ears, Seasonal Allergies, Sinus Pain, Sore Throat, Visual Disturbances, Wears glasses/contact lenses and Yellow Eyes. Respiratory Present- Snoring. Not Present- Bloody sputum, Chronic Cough, Difficulty Breathing and Wheezing. Breast Not Present- Breast Mass, Breast Pain, Nipple Discharge and Skin Changes. Cardiovascular Not Present- Chest Pain, Difficulty Breathing Lying Down, Leg Cramps, Palpitations, Rapid Heart Rate, Shortness of Breath and Swelling of Extremities. Gastrointestinal Not Present- Abdominal Pain, Bloating, Bloody Stool, Change in Bowel Habits, Chronic diarrhea, Constipation, Difficulty Swallowing, Excessive gas, Gets full quickly at meals, Hemorrhoids, Indigestion, Nausea, Rectal Pain and Vomiting. Female Genitourinary Not Present- Frequency, Nocturia, Painful Urination, Pelvic Pain and Urgency. Musculoskeletal Present- Back Pain, Joint Pain, Joint Stiffness and Swelling of Extremities. Not Present- Muscle Pain and Muscle Weakness. Neurological Not Present- Decreased Memory, Fainting, Headaches, Numbness, Seizures, Tingling, Tremor, Trouble walking and Weakness. Psychiatric Not Present- Anxiety, Bipolar, Change in Sleep Pattern, Depression, Fearful and Frequent crying. Endocrine Not Present- Cold Intolerance, Excessive Hunger, Hair Changes, Heat Intolerance, Hot flashes and New Diabetes. Hematology Present- Easy Bruising. Not Present- Excessive bleeding, Gland problems, HIV and Persistent Infections.   Physical Exam  General Mental Status-Alert. General Appearance-Consistent with stated age. Hydration-Well  hydrated. Voice-Normal.  Head and Neck Head-normocephalic, atraumatic with no lesions or palpable masses. Trachea-midline. Thyroid Gland Characteristics - normal size and consistency.  Eye Eyeball - Bilateral-Extraocular movements intact. Sclera/Conjunctiva - Bilateral-No scleral icterus.  Chest and Lung Exam Chest and lung exam reveals -quiet, even and easy respiratory effort with no use of accessory muscles and on auscultation, normal breath sounds, no adventitious sounds and normal vocal resonance. Inspection Chest Wall - Normal. Back - normal.  Breast Note: Significant ecchymoses and large, 5-6 cm, hematoma left breast lower outer quadrant. No other significant masses or skin changes noted in either breast. No palpable axillary adenopathy. She reports her breast size to be 36C   Cardiovascular Cardiovascular examination reveals -normal heart sounds, regular rate and rhythm with no murmurs and normal pedal pulses bilaterally.  Abdomen Inspection Inspection of the abdomen reveals - No Hernias. Palpation/Percussion Palpation and Percussion of the abdomen reveal - Soft, Non Tender, No Rebound tenderness, No Rigidity (guarding) and No hepatosplenomegaly. Auscultation Auscultation of the abdomen reveals - Bowel sounds normal. Note: Laparoscopic cholecystectomy trocar scars. Pfannenstiel incision.   Neurologic Neurologic evaluation reveals -alert and oriented x 3 with no impairment of recent or remote memory. Mental Status-Normal.  Musculoskeletal Normal Exam - Left-Upper Extremity Strength Normal and Lower Extremity Strength Normal. Normal Exam - Right-Upper Extremity Strength Normal and Lower Extremity Strength Normal.  Lymphatic Head & Neck  General Head & Neck Lymphatics: Bilateral - Description - Normal. Axillary  General Axillary Region: Bilateral - Description - Normal. Tenderness - Non Tender. Femoral & Inguinal  Generalized Femoral  & Inguinal Lymphatics: Bilateral - Description - Normal. Tenderness - Non Tender.    Assessment & Plan  BREAST CANCER OF LOWER-OUTER QUADRANT OF LEFT FEMALE BREAST (C50.512)   Your recent imaging studies and biopsy showed that you have an invasive ductal carcinoma of the left breast, lower outer quadrant. This appears to be 1.6 mm in diameter, and the tumor is positive for estrogen and progesterone receptors. Negative for  HER-2/neu. Ultrasound of the left axilla is normal. Imaging studies of the right breast show a benign intramammary lymph node  You have been evaluated by Dr. Jana Hakim, Dr. Isidore Moos, and me. We have discussed the options of mastectomy with sentinel lymph node biopsy, with or without reconstruction. Alternatively lumpectomy with sentinel lymph node biopsy and postop radiation therapy. You have been referred for genetic counseling and possible genetic testing.  We have decided together that we will schedule you for left breast lumpectomy with radioactive seed localization and left axillary sentinel lymph node biopsy. We will wait about 3 weeks for the large hematoma to settle down and resolve We have discussed the indications, techniques, numerous risks of the surgery After the surgery you will be referred back to Dr. Jana Hakim and Dr. Isidore Moos.  Please read over the printed information that we gave you.  Dr. Darrel Hoover office will call you Thursday or Friday to begin the scheduling process.  TRAUMATIC HEMATOMA OF FEMALE BREAST, LEFT, INITIAL ENCOUNTER (S20.02XA) Impression: 5-6 cm hematoma left breast, following image guided biopsy. We will delay surgery 3 weeks HISTORY OF TOTAL ABDOMINAL HYSTERECTOMY AND BILATERAL SALPINGO-OOPHORECTOMY (Z90.710) HISTORY OF LAPAROSCOPIC CHOLECYSTECTOMY (Z90.49) FAMILY HISTORY OF BREAST CANCER IN FEMALE (Z80.3) Impression: Sister diagnosed age 79. Survived. Head myeloma. Mother diagnosed age 75. Survived. Genetic counseling and testing  planned.    Edsel Petrin. Dalbert Batman, M.D., [facility] General and Minimally invasive Surgery Breast and Colorectal Surgery Office:   [phone] Pager:   [phone]

## 2015-07-11 ENCOUNTER — Encounter (HOSPITAL_COMMUNITY)
Admission: RE | Disposition: A | Discharge status: Home / Self Care | Payer: Self-pay | Source: Ambulatory Visit | Attending: General Surgery

## 2015-07-11 ENCOUNTER — Encounter (HOSPITAL_COMMUNITY): Payer: Self-pay | Admitting: *Deleted

## 2015-07-11 ENCOUNTER — Ambulatory Visit
Admission: RE | Admit: 2015-07-11 | Discharge: 2015-07-11 | Disposition: A | Discharge status: Home / Self Care | Payer: Medicare Other | Source: Ambulatory Visit | Attending: General Surgery | Admitting: General Surgery

## 2015-07-11 ENCOUNTER — Ambulatory Visit (HOSPITAL_COMMUNITY)
Admission: RE | Admit: 2015-07-11 | Discharge: 2015-07-11 | Disposition: A | Discharge status: Home / Self Care | Payer: Medicare Other | Source: Ambulatory Visit | Attending: General Surgery | Admitting: General Surgery

## 2015-07-11 ENCOUNTER — Ambulatory Visit (HOSPITAL_COMMUNITY): Payer: Medicare Other | Admitting: Certified Registered"

## 2015-07-11 DIAGNOSIS — Z90710 Acquired absence of both cervix and uterus: Secondary | ICD-10-CM | POA: Diagnosis not present

## 2015-07-11 DIAGNOSIS — Z170 Estrogen receptor positive status [ER+]: Secondary | ICD-10-CM | POA: Diagnosis not present

## 2015-07-11 DIAGNOSIS — Z803 Family history of malignant neoplasm of breast: Secondary | ICD-10-CM | POA: Insufficient documentation

## 2015-07-11 DIAGNOSIS — Z87891 Personal history of nicotine dependence: Secondary | ICD-10-CM | POA: Insufficient documentation

## 2015-07-11 DIAGNOSIS — Y838 Other surgical procedures as the cause of abnormal reaction of the patient, or of later complication, without mention of misadventure at the time of the procedure: Secondary | ICD-10-CM | POA: Insufficient documentation

## 2015-07-11 DIAGNOSIS — N99840 Postprocedural hematoma of a genitourinary system organ or structure following a genitourinary system procedure: Secondary | ICD-10-CM | POA: Insufficient documentation

## 2015-07-11 DIAGNOSIS — C50512 Malignant neoplasm of lower-outer quadrant of left female breast: Secondary | ICD-10-CM | POA: Insufficient documentation

## 2015-07-11 HISTORY — PX: RADIOACTIVE SEED GUIDED PARTIAL MASTECTOMY WITH AXILLARY SENTINEL LYMPH NODE BIOPSY: SHX6520

## 2015-07-11 SURGERY — RADIOACTIVE SEED GUIDED PARTIAL MASTECTOMY WITH AXILLARY SENTINEL LYMPH NODE BIOPSY
Anesthesia: Regional | Site: Breast | Laterality: Left

## 2015-07-11 MED ORDER — CEFAZOLIN SODIUM-DEXTROSE 2-4 GM/100ML-% IV SOLN
INTRAVENOUS | Status: AC
  Filled 2015-07-11: qty 100

## 2015-07-11 MED ORDER — LACTATED RINGERS IV SOLN
INTRAVENOUS | Status: DC
  Administered 2015-07-11: 09:00:00 via INTRAVENOUS

## 2015-07-11 MED ORDER — PROPOFOL 10 MG/ML IV BOLUS
INTRAVENOUS | Status: DC | PRN
  Administered 2015-07-11: 120 mg via INTRAVENOUS

## 2015-07-11 MED ORDER — SODIUM CHLORIDE 0.9 % IJ SOLN
INTRAVENOUS | Status: DC | PRN
  Administered 2015-07-11: 11:00:00

## 2015-07-11 MED ORDER — LIDOCAINE HCL (CARDIAC) 20 MG/ML IV SOLN
INTRAVENOUS | Status: DC | PRN
  Administered 2015-07-11: 50 mg via INTRAVENOUS

## 2015-07-11 MED ORDER — FENTANYL CITRATE (PF) 100 MCG/2ML IJ SOLN: 100.0000 ug | Freq: Once | INTRAMUSCULAR | Status: DC

## 2015-07-11 MED ORDER — FENTANYL CITRATE (PF) 100 MCG/2ML IJ SOLN
INTRAMUSCULAR | Status: DC | PRN
  Administered 2015-07-11 (×2): 50 ug via INTRAVENOUS

## 2015-07-11 MED ORDER — BUPIVACAINE-EPINEPHRINE (PF) 0.5% -1:200000 IJ SOLN
INTRAMUSCULAR | Status: DC | PRN
  Administered 2015-07-11: 30 mL

## 2015-07-11 MED ORDER — HYDROMORPHONE HCL 1 MG/ML IJ SOLN
0.2500 mg | INTRAMUSCULAR | Status: DC | PRN
  Administered 2015-07-11: 0.5 mg via INTRAVENOUS

## 2015-07-11 MED ORDER — LACTATED RINGERS IV SOLN
INTRAVENOUS | Status: DC | PRN
  Administered 2015-07-11 (×2): via INTRAVENOUS

## 2015-07-11 MED ORDER — 0.9 % SODIUM CHLORIDE (POUR BTL) OPTIME
TOPICAL | Status: DC | PRN
  Administered 2015-07-11: 1000 mL

## 2015-07-11 MED ORDER — CHLORHEXIDINE GLUCONATE 4 % EX LIQD: 1.0000 "application " | Freq: Once | CUTANEOUS | Status: DC

## 2015-07-11 MED ORDER — PROPOFOL 10 MG/ML IV BOLUS
INTRAVENOUS | Status: AC
  Filled 2015-07-11: qty 20

## 2015-07-11 MED ORDER — ACETAMINOPHEN 10 MG/ML IV SOLN
INTRAVENOUS | Status: DC | PRN
  Administered 2015-07-11: 1000 mg via INTRAVENOUS

## 2015-07-11 MED ORDER — MIDAZOLAM HCL 5 MG/ML IJ SOLN: 2.0000 mg | Freq: Once | INTRAMUSCULAR | Status: DC

## 2015-07-11 MED ORDER — TECHNETIUM TC 99M SULFUR COLLOID FILTERED
1.0000 | Freq: Once | INTRAVENOUS | Status: AC | PRN
  Administered 2015-07-11: 1 via INTRADERMAL

## 2015-07-11 MED ORDER — HYDROMORPHONE HCL 1 MG/ML IJ SOLN
INTRAMUSCULAR | Status: DC
Start: 2015-07-11 — End: 2015-07-11
  Filled 2015-07-11: qty 1

## 2015-07-11 MED ORDER — FENTANYL CITRATE (PF) 250 MCG/5ML IJ SOLN
INTRAMUSCULAR | Status: AC
  Filled 2015-07-11: qty 5

## 2015-07-11 MED ORDER — CEFAZOLIN SODIUM-DEXTROSE 2-4 GM/100ML-% IV SOLN
2.0000 g | INTRAVENOUS | Status: AC
  Administered 2015-07-11: 2 g via INTRAVENOUS
  Filled 2015-07-11: qty 100

## 2015-07-11 MED ORDER — FENTANYL CITRATE (PF) 100 MCG/2ML IJ SOLN
INTRAMUSCULAR | Status: AC
  Administered 2015-07-11: 100 ug
  Filled 2015-07-11: qty 2

## 2015-07-11 MED ORDER — HYDROCODONE-ACETAMINOPHEN 5-325 MG PO TABS: 1.0000 | ORAL_TABLET | Freq: Four times a day (QID) | ORAL | Status: DC | PRN

## 2015-07-11 MED ORDER — MIDAZOLAM HCL 2 MG/2ML IJ SOLN
INTRAMUSCULAR | Status: AC
  Administered 2015-07-11: 2 mg
  Filled 2015-07-11: qty 2

## 2015-07-11 MED ORDER — ACETAMINOPHEN 10 MG/ML IV SOLN
INTRAVENOUS | Status: AC
  Filled 2015-07-11: qty 100

## 2015-07-11 MED ORDER — METHYLENE BLUE 0.5 % INJ SOLN
INTRAVENOUS | Status: AC
  Filled 2015-07-11: qty 10

## 2015-07-11 MED ORDER — BUPIVACAINE-EPINEPHRINE 0.25% -1:200000 IJ SOLN
INTRAMUSCULAR | Status: DC | PRN
  Administered 2015-07-11: 30 mL

## 2015-07-11 MED ORDER — BUPIVACAINE-EPINEPHRINE (PF) 0.25% -1:200000 IJ SOLN
INTRAMUSCULAR | Status: AC
  Filled 2015-07-11: qty 30

## 2015-07-11 MED ORDER — ONDANSETRON HCL 4 MG/2ML IJ SOLN
INTRAMUSCULAR | Status: DC | PRN
  Administered 2015-07-11: 4 mg via INTRAVENOUS

## 2015-07-11 SURGICAL SUPPLY — 61 items
ADH SKN CLS APL DERMABOND .7 (GAUZE/BANDAGES/DRESSINGS) ×1
APPLIER CLIP 9.375 MED OPEN (MISCELLANEOUS) ×3
APR CLP MED 9.3 20 MLT OPN (MISCELLANEOUS) ×1
BINDER BREAST LRG (GAUZE/BANDAGES/DRESSINGS) IMPLANT
BINDER BREAST XLRG (GAUZE/BANDAGES/DRESSINGS) ×2 IMPLANT
BLADE SURG 15 STRL LF DISP TIS (BLADE) ×1 IMPLANT
BLADE SURG 15 STRL SS (BLADE) ×3
CANISTER SUCTION 2500CC (MISCELLANEOUS) IMPLANT
CHLORAPREP W/TINT 26ML (MISCELLANEOUS) ×3 IMPLANT
CLIP APPLIE 9.375 MED OPEN (MISCELLANEOUS) ×1 IMPLANT
CONT SPEC 4OZ CLIKSEAL STRL BL (MISCELLANEOUS) ×3 IMPLANT
COVER PROBE W GEL 5X96 (DRAPES) ×3 IMPLANT
COVER SURGICAL LIGHT HANDLE (MISCELLANEOUS) ×3 IMPLANT
DECANTER SPIKE VIAL GLASS SM (MISCELLANEOUS) ×2 IMPLANT
DERMABOND ADVANCED (GAUZE/BANDAGES/DRESSINGS) ×2
DERMABOND ADVANCED .7 DNX12 (GAUZE/BANDAGES/DRESSINGS) ×1 IMPLANT
DEVICE DUBIN SPECIMEN MAMMOGRA (MISCELLANEOUS) ×3 IMPLANT
DRAPE CHEST BREAST 15X10 FENES (DRAPES) ×3 IMPLANT
DRAPE PROXIMA HALF (DRAPES) ×3 IMPLANT
DRAPE UTILITY XL STRL (DRAPES) ×3 IMPLANT
DRSG PAD ABDOMINAL 8X10 ST (GAUZE/BANDAGES/DRESSINGS) ×3 IMPLANT
ELECT CAUTERY BLADE 6.4 (BLADE) ×3 IMPLANT
ELECT REM PT RETURN 9FT ADLT (ELECTROSURGICAL) ×3
ELECTRODE REM PT RTRN 9FT ADLT (ELECTROSURGICAL) ×1 IMPLANT
GAUZE SPONGE 4X4 12PLY STRL (GAUZE/BANDAGES/DRESSINGS) ×3 IMPLANT
GLOVE BIOGEL PI IND STRL 7.0 (GLOVE) IMPLANT
GLOVE BIOGEL PI IND STRL 7.5 (GLOVE) IMPLANT
GLOVE BIOGEL PI INDICATOR 7.0 (GLOVE) ×2
GLOVE BIOGEL PI INDICATOR 7.5 (GLOVE) ×2
GLOVE EUDERMIC 7 POWDERFREE (GLOVE) ×6 IMPLANT
GLOVE SURG SS PI 7.0 STRL IVOR (GLOVE) ×6 IMPLANT
GOWN STRL REUS W/ TWL LRG LVL3 (GOWN DISPOSABLE) ×1 IMPLANT
GOWN STRL REUS W/ TWL XL LVL3 (GOWN DISPOSABLE) ×1 IMPLANT
GOWN STRL REUS W/TWL LRG LVL3 (GOWN DISPOSABLE) ×6
GOWN STRL REUS W/TWL XL LVL3 (GOWN DISPOSABLE) ×3
ILLUMINATOR WAVEGUIDE N/F (MISCELLANEOUS) IMPLANT
KIT BASIN OR (CUSTOM PROCEDURE TRAY) ×3 IMPLANT
KIT MARKER MARGIN INK (KITS) ×3 IMPLANT
LIGHT WAVEGUIDE WIDE FLAT (MISCELLANEOUS) ×2 IMPLANT
NDL BLUNT 16X1.5 OR ONLY (NEEDLE) IMPLANT
NDL HYPO 25X1 1.5 SAFETY (NEEDLE) ×1 IMPLANT
NDL SAFETY ECLIPSE 18X1.5 (NEEDLE) IMPLANT
NEEDLE BLUNT 16X1.5 OR ONLY (NEEDLE) ×3 IMPLANT
NEEDLE HYPO 18GX1.5 SHARP (NEEDLE) ×3
NEEDLE HYPO 25X1 1.5 SAFETY (NEEDLE) ×3 IMPLANT
NS IRRIG 1000ML POUR BTL (IV SOLUTION) IMPLANT
PACK SURGICAL SETUP 50X90 (CUSTOM PROCEDURE TRAY) ×3 IMPLANT
PENCIL BUTTON HOLSTER BLD 10FT (ELECTRODE) ×3 IMPLANT
SPONGE GAUZE 4X4 12PLY STER LF (GAUZE/BANDAGES/DRESSINGS) ×2 IMPLANT
SPONGE LAP 18X18 X RAY DECT (DISPOSABLE) ×3 IMPLANT
SUT MNCRL AB 4-0 PS2 18 (SUTURE) ×5 IMPLANT
SUT SILK 2 0 SH (SUTURE) ×2 IMPLANT
SUT VIC AB 3-0 SH 18 (SUTURE) ×3 IMPLANT
SYR 5ML LL (SYRINGE) ×2 IMPLANT
SYR BULB 3OZ (MISCELLANEOUS) ×3 IMPLANT
SYR CONTROL 10ML LL (SYRINGE) ×3 IMPLANT
TOWEL OR 17X24 6PK STRL BLUE (TOWEL DISPOSABLE) ×3 IMPLANT
TOWEL OR 17X26 10 PK STRL BLUE (TOWEL DISPOSABLE) ×3 IMPLANT
TUBE CONNECTING 12'X1/4 (SUCTIONS) ×1
TUBE CONNECTING 12X1/4 (SUCTIONS) ×2 IMPLANT
YANKAUER SUCT BULB TIP NO VENT (SUCTIONS) IMPLANT

## 2015-07-11 NOTE — Op Note (Signed)
Patient Name:           Stacey Barrett   Date of Surgery:        07/11/2015  Pre op Diagnosis:      Invasive carcinoma left breast, lower outer central, receptor positive, HER-2 negative, clinical stage TIc, N0                                       Post biopsy hematoma left breast  Post op Diagnosis:    Same  Procedure:                 Inject blue dye left breast, left breast lumpectomy with radioactive seed localization, left axillary sentinel lymph node biopsy  Surgeon:                     Edsel Petrin. Dalbert Batman, M.D., FACS  Assistant:                      OR staff  Operative Indications:    This is a pleasant 70 year old Caucasian female, referred by Dr. Pamelia Hoit at the [facility] for evaluation and management of a new diagnosis of invasive carcinoma left breast, lower outer quadrant. Heath Gold is her PCP. She was evaluated in the [facility]  by Dr. Jana Hakim, Dr. Isidore Moos, and me. Her case was also discussed at breast conference.     She has no prior history of breast problems. Routine screening mammogram showed a suspicious mass in the left breast, lower outer quadrant. 1.6 cm in diameter at 5 o'clock position. This is in the deep central breast. In the right breast they found a normal intramammary lymph node and no further evaluation was felt to be necessary. Ultrasound of the left axilla was negative.      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 Clinical stage T1c, N0.     Comorbidities include past history of TAH and BSO when she developed a postop pulmonary embolism many years ago. Laparoscopic cholecystectomy. Facelift. Cataracts. She takes no medicines. Family history reveals that her sister had breast cancer age 5 and survived. Her sister also had multiple myeloma. Mother developed breast cancer at age 21 and survived and lived to age 73.    We talked  a long time about the approaches and options for surgical management of breast cancer. We discussed the options of total mastectomies, sentinel lymph node biopsy with or without reconstruction. We talked about alternative" option of lumpectomy and sentinel lymph node biopsy and postop radiation therapy. She wants to have a lumpectomy and breast conservation. She is referred for genetic counseling and genetic testing and that is pending. Dr. Jana Hakim requests Oncotype testing on the tumor      I told her we would wait about 3 weeks because of her large hematoma postbiopsy. She'll be scheduled for left breast lumpectomy with radioactive seed localization, injection of blue dye, and left axillary sentinel lymph node biopsy. . She agrees with this plan.  Operative Findings:       Left pectoral block was performed in the holding area by the anesthesia department.  Left breast technetium radionuclide was injected by the nuclear medicine technician into the left breast in the holding area.  I was able to hear the audible signal  of the I-125 radioactive seed using the neoprobe in the holding area.      There was still a significant hematoma in the left breast centrally somewhat anterior to the cancer.  This was at least 3 cm in diameter.  This was removed along with the cancer.  The specimen mammogram looked good with radioactive seed in the marker clip just behind the hematoma but apparently with a good margin posteriorly.  The broad posterior margin was the pectoralis muscle, and so there was no further resection to be performed posteriorly.     I found 3 axillary sentinel lymph nodes.  Procedure in Detail:          Following the induction of general endotracheal anesthesia a surgical timeout was performed.  Intravenous antibiotics were given.  Following alcohol prep I injected 5 mL of dilute methylene blue into the left breast, subareolar area and massaged breast for a few minutes.  The left breast and left  axilla were then prepped and draped in a sterile fashion.  0.5% Marcaine with epinephrine was used as a local infiltration anesthetic into the skin and subcutaneous tissue.     Using the neoprobe I found the area of maximum radioactivity at the areolar margin at about the 5:00 position.  I made a curvilinear incision at the areolar margin in the lower outer quadrant, hidden scar technique.  Using a neoprobe I dissected down into the breast tissue and widely around the hematoma and the cancer on the back to the pectoralis major muscle.  I used the lighted Inv[employer] retractors to perform this hidden scar technique.  The specimen was removed and marked with silk sutures and a 6 color ink kit to orient the pathologist.  Specimen mammogram looked good as discussed above.      I then made a transverse incision in the left axilla at the hairline.  Using the neoprobe on technetium 99 I dissected down through the clavipectoral fascia into the axillary space.  I found 3 sentinel lymph nodes, 2 of these were in level I and one  was up in level II.  There was no more radioactivity and no more sentinel lymph nodes.  The lumpectomy cavity was marked with metal clips in the 5 cardinal positions.  Both wounds were irrigated with saline.  Hemostasis was excellent and achieved with electrocautery.  The wounds were both closed in layers with multiple interrupted 3-0 Vicryl and both wounds were closed on the skin with a running subcuticular 4-0 Monocryl and Dermabond.  Dry bandages and a breast binder were placed.  The patient tolerated the procedure well was taken to PACU in stable condition.  EBL 20 mL or less.  Counts correct.  Complications none.         Edsel Petrin. Dalbert Batman, M.D., FACS General and Minimally Invasive Surgery Breast and Colorectal Surgery  07/11/2015 12:34 PM

## 2015-07-11 NOTE — Anesthesia Procedure Notes (Addendum)
Anesthesia Regional Block:  Pectoralis block  Pre-Anesthetic Checklist: ,, timeout performed, Correct Patient, Correct Site, Correct Laterality, Correct Procedure, Correct Position, site marked, Risks and benefits discussed, pre-op evaluation, post-op pain management  Laterality: Left  Prep: Maximum Sterile Barrier Precautions used and chloraprep       Needles:  Injection technique: Single-shot  Needle Type: Echogenic Stimulator Needle     Needle Length: 9cm 9 cm Needle Gauge: 21 and 21 G    Additional Needles:  Procedures: ultrasound guided (picture in chart) Pectoralis block Narrative:  Start time: 07/11/2015 9:50 AM End time: 07/11/2015 10:00 AM Injection made incrementally with aspirations every 5 mL. Anesthesiologist: Roderic Palau  Additional Notes: 2% Lidocaine skin wheel.   Procedure Name: LMA Insertion Date/Time: 07/11/2015 11:22 AM Performed by: Eligha Bridegroom Pre-anesthesia Checklist: Emergency Drugs available, Patient identified, Timeout performed, Suction available and Patient being monitored Patient Re-evaluated:Patient Re-evaluated prior to inductionOxygen Delivery Method: Circle system utilized Preoxygenation: Pre-oxygenation with 100% oxygen Intubation Type: IV induction Ventilation: Mask ventilation without difficulty LMA: LMA inserted LMA Size: 4.0 Number of attempts: 1 Placement Confirmation: positive ETCO2 and breath sounds checked- equal and bilateral Tube secured with: Tape

## 2015-07-11 NOTE — Anesthesia Preprocedure Evaluation (Addendum)
Anesthesia Evaluation  Patient identified by MRN, date of birth, ID band Patient awake    Reviewed: Allergy & Precautions, H&P , NPO status , Patient's Chart, lab work & pertinent test results  Airway Mallampati: I  TM Distance: >3 FB Neck ROM: Full    Dental no notable dental hx. (+) Teeth Intact, Dental Advisory Given   Pulmonary neg pulmonary ROS, former smoker,    Pulmonary exam normal breath sounds clear to auscultation       Cardiovascular negative cardio ROS   Rhythm:Regular Rate:Normal     Neuro/Psych Depression negative neurological ROS     GI/Hepatic negative GI ROS, Neg liver ROS,   Endo/Other  negative endocrine ROS  Renal/GU negative Renal ROS  negative genitourinary   Musculoskeletal   Abdominal   Peds  Hematology negative hematology ROS (+)   Anesthesia Other Findings   Reproductive/Obstetrics negative OB ROS                            Anesthesia Physical Anesthesia Plan  ASA: II  Anesthesia Plan: General and Regional   Post-op Pain Management: GA combined w/ Regional for post-op pain   Induction: Intravenous  Airway Management Planned: LMA  Additional Equipment:   Intra-op Plan:   Post-operative Plan: Extubation in OR  Informed Consent: I have reviewed the patients History and Physical, chart, labs and discussed the procedure including the risks, benefits and alternatives for the proposed anesthesia with the patient or authorized representative who has indicated his/her understanding and acceptance.   Dental advisory given  Plan Discussed with: CRNA  Anesthesia Plan Comments:         Anesthesia Quick Evaluation

## 2015-07-11 NOTE — Interval H&P Note (Signed)
History and Physical Interval Note:  07/11/2015 10:00 AM  Stacey Barrett  has presented today for surgery, with the diagnosis of LEFT BREAST CANCER LOWER OUTER QUADRANT  The various methods of treatment have been discussed with the patient and family. After consideration of risks, benefits and other options for treatment, the patient has consented to  Procedure(s): INJECT BLUE DYE LEFT BREAST, RADIOACTIVE SEED GUIDED LEFT BREAST LUMPECTOMY WITH LEFT AXILLARY SENTINEL LYMPH NODE BIOPSY  (Left) as a surgical intervention .  The patient's history has been reviewed, patient examined, no change in status, stable for surgery.  I have reviewed the patient's chart and labs.  Questions were answered to the patient's satisfaction.     Adin Hector

## 2015-07-11 NOTE — OR Nursing (Signed)
0.5 mg Dilaudid wasted in trash. Witnessed by Bryson Ha, RN

## 2015-07-11 NOTE — Transfer of Care (Signed)
Immediate Anesthesia Transfer of Care Note  Patient: Stacey Barrett  Procedure(s) Performed: Procedure(s): INJECT BLUE DYE LEFT BREAST, RADIOACTIVE SEED GUIDED LEFT BREAST LUMPECTOMY WITH LEFT AXILLARY SENTINEL LYMPH NODE BIOPSY  (Left)  Patient Location: PACU  Anesthesia Type:General  Level of Consciousness: awake and alert   Airway & Oxygen Therapy: Patient Spontanous Breathing and Patient connected to nasal cannula oxygen  Post-op Assessment: Report given to RN and Post -op Vital signs reviewed and stable  Post vital signs: Reviewed and stable  Last Vitals:  Filed Vitals:   07/11/15 0906  BP: 128/64  Pulse: 85  Temp: 37.2 C  Resp: 18    Last Pain:  Filed Vitals:   07/11/15 0907  PainSc: 2       Patients Stated Pain Goal: 5 ([id number])  Complications: No apparent anesthesia complications

## 2015-07-11 NOTE — Discharge Instructions (Signed)
[facility] °Office Phone Number [phone] ° °BREAST BIOPSY/ PARTIAL MASTECTOMY: POST OP INSTRUCTIONS ° °Always review your discharge instruction sheet given to you by the facility where your surgery was performed. ° °IF YOU HAVE DISABILITY OR FAMILY LEAVE FORMS, YOU MUST BRING THEM TO THE OFFICE FOR PROCESSING.  DO NOT GIVE THEM TO YOUR DOCTOR. ° °1. A prescription for pain medication may be given to you upon discharge.  Take your pain medication as prescribed, if needed.  If narcotic pain medicine is not needed, then you may take acetaminophen (Tylenol) or ibuprofen (Advil) as needed. °2. Take your usually prescribed medications unless otherwise directed °3. If you need a refill on your pain medication, please contact your pharmacy.  They will contact our office to request authorization.  Prescriptions will not be filled after 5pm or on week-ends. °4. You should eat very light the first 24 hours after surgery, such as soup, crackers, pudding, etc.  Resume your normal diet the day after surgery. °5. Most patients will experience some swelling and bruising in the breast.  Ice packs and a good support bra will help.  Swelling and bruising can take several days to resolve.  °6. It is common to experience some constipation if taking pain medication after surgery.  Increasing fluid intake and taking a stool softener will usually help or prevent this problem from occurring.  A mild laxative (Milk of Magnesia or Miralax) should be taken according to package directions if there are no bowel movements after 48 hours. °7. Unless discharge instructions indicate otherwise, you may remove your bandages 24-48 hours after surgery, and you may shower at that time.  You may have steri-strips (small skin tapes) in place directly over the incision.  These strips should be left on the skin for 7-10 days.  If your surgeon used skin glue on the incision, you may shower in 24 hours.  The glue will flake off over the  next 2-3 weeks.  Any sutures or staples will be removed at the office during your follow-up visit. °8. ACTIVITIES:  You may resume regular daily activities (gradually increasing) beginning the next day.  Wearing a good support bra or sports bra minimizes pain and swelling.  You may have sexual intercourse when it is comfortable. °a. You may drive when you no longer are taking prescription pain medication, you can comfortably wear a seatbelt, and you can safely maneuver your car and apply brakes. °b. RETURN TO WORK:  ______________________________________________________________________________________ °9. You should see your doctor in the office for a follow-up appointment approximately two weeks after your surgery.  Your doctor’s nurse will typically make your follow-up appointment when she calls you with your pathology report.  Expect your pathology report 2-3 business days after your surgery.  You may call to check if you do not hear from us after three days. °10. OTHER INSTRUCTIONS: _______________________________________________________________________________________________ _____________________________________________________________________________________________________________________________________ °_____________________________________________________________________________________________________________________________________ °_____________________________________________________________________________________________________________________________________ ° °WHEN TO CALL YOUR DOCTOR: °1. Fever over 101.0 °2. Nausea and/or vomiting. °3. Extreme swelling or bruising. °4. Continued bleeding from incision. °5. Increased pain, redness, or drainage from the incision. ° °The clinic staff is available to answer your questions during regular business hours.  Please don’t hesitate to call and ask to speak to one of the nurses for clinical concerns.  If you have a medical emergency, go to the nearest  emergency room or call 911.  A surgeon from [facility] is always on call at the hospital. ° °For further questions, please visit [email or web address]  °

## 2015-07-11 NOTE — Anesthesia Postprocedure Evaluation (Signed)
Anesthesia Post Note  Patient: Stacey Barrett  Procedure(s) Performed: Procedure(s) (LRB): INJECT BLUE DYE LEFT BREAST, RADIOACTIVE SEED GUIDED LEFT BREAST LUMPECTOMY WITH LEFT AXILLARY SENTINEL LYMPH NODE BIOPSY  (Left)  Patient location during evaluation: PACU Anesthesia Type: General and Regional Level of consciousness: awake and alert Pain management: pain level controlled Vital Signs Assessment: post-procedure vital signs reviewed and stable Respiratory status: spontaneous breathing, nonlabored ventilation and respiratory function stable Cardiovascular status: blood pressure returned to baseline and stable Postop Assessment: no signs of nausea or vomiting Anesthetic complications: no    Last Vitals:  Filed Vitals:   07/11/15 1337 07/11/15 1346  BP: 125/68   Pulse: 78   Temp:  36.5 C  Resp: 14     Last Pain:  Filed Vitals:   07/11/15 1355  PainSc: 0-No pain                 Jkai Arwood,W. EDMOND

## 2015-07-12 ENCOUNTER — Encounter (HOSPITAL_COMMUNITY): Payer: Self-pay | Admitting: General Surgery

## 2015-07-19 ENCOUNTER — Telehealth: Payer: Self-pay | Admitting: *Deleted

## 2015-07-19 NOTE — Telephone Encounter (Signed)
Ordered Oncotype Dx test per MD.  Faxed order to [facility] & called Stacey Barrett to verify she received it.  Faxed order to [facility].  Faxed order to [facility].  Placed a note to f/u for results.

## 2015-07-21 ENCOUNTER — Other Ambulatory Visit: Payer: Medicare Other

## 2015-07-21 ENCOUNTER — Ambulatory Visit (HOSPITAL_BASED_OUTPATIENT_CLINIC_OR_DEPARTMENT_OTHER): Payer: Medicare Other | Admitting: Genetic Counselor

## 2015-07-21 DIAGNOSIS — Z803 Family history of malignant neoplasm of breast: Secondary | ICD-10-CM

## 2015-07-21 DIAGNOSIS — Z315 Encounter for genetic counseling: Secondary | ICD-10-CM

## 2015-07-21 DIAGNOSIS — Z807 Family history of other malignant neoplasms of lymphoid, hematopoietic and related tissues: Secondary | ICD-10-CM

## 2015-07-21 DIAGNOSIS — C50512 Malignant neoplasm of lower-outer quadrant of left female breast: Secondary | ICD-10-CM

## 2015-07-21 DIAGNOSIS — Z85828 Personal history of other malignant neoplasm of skin: Secondary | ICD-10-CM | POA: Diagnosis not present

## 2015-07-21 DIAGNOSIS — Z800 Family history of malignant neoplasm of digestive organs: Secondary | ICD-10-CM

## 2015-07-21 DIAGNOSIS — Z808 Family history of malignant neoplasm of other organs or systems: Secondary | ICD-10-CM

## 2015-07-22 ENCOUNTER — Encounter: Payer: Self-pay | Admitting: Genetic Counselor

## 2015-07-22 DIAGNOSIS — Z803 Family history of malignant neoplasm of breast: Secondary | ICD-10-CM | POA: Insufficient documentation

## 2015-07-22 DIAGNOSIS — Z800 Family history of malignant neoplasm of digestive organs: Secondary | ICD-10-CM | POA: Insufficient documentation

## 2015-07-22 NOTE — Progress Notes (Signed)
REFERRING PROVIDER: Lurline Del, MD  OTHER PROVIDER(S): Eppie Gibson, MD Fanny Skates, MD  PRIMARY PROVIDER:  Salena Saner., MD  PRIMARY REASON FOR VISIT:  1. Breast cancer of lower-outer quadrant of left female breast ([place])   2. Family history of breast cancer in first degree relative   3. Family history of colon cancer   4. Personal history of squamous cell carcinoma of skin   5. Family history of skin cancer   6. Family history of multiple myeloma      HISTORY OF PRESENT ILLNESS:   Stacey Barrett, a 70 y.o. female, was seen for a [facility] cancer genetics consultation at the request of Dr. Jana Hakim due to a personal history of breast cancer and family history of breast and other cancers.  Stacey Barrett presents to clinic today to discuss the possibility of a hereditary predisposition to cancer, genetic testing, and to further clarify her future cancer risks, as well as potential cancer risks for family members.   In April 2017, at the age of 38, Stacey Barrett was diagnosed with invasive ductal carcinoma of the left breast. This was treated with left lumpectomy.  Stacey Barrett also reports a history of squamous cell carcinoma skin cancer in 2015.  HORMONAL RISK FACTORS:  Menarche was at age 5.  First live birth at age 69.  OCP use for approximately 6 years.  Ovaries intact: no.  Hysterectomy: yes, at age 69 to treat endometriosis Menopausal status: postmenopausal.  HRT use: 3-4 months. Colonoscopy: yes; most recent was in 2016 with Dr. Collene Mares; 2 cecal polyps found in 2005; typically goes every 3 years per pt report and some polyps are found each time - maybe 10 or less polyps found total. Mammogram within the last year: yes. Number of breast biopsies: 1. Up to date with pelvic exams:  Not assessed. Any excessive radiation exposure/other exposures in the past:  no, but does report some additional secondhand smoke exposure  Past Medical History  Diagnosis Date   . Breast cancer of lower-outer quadrant of left female breast ([place]) 06/01/2015  . Breast cancer ([place])   . Skin cancer     05/2013  . Hyperlipidemia   . Depression   . Arthritis     Past Surgical History  Procedure Laterality Date  . Cholecystectomy    . Abdominal hysterectomy    . Facial cosmetic surgery    . Fatty tumor Left     under left arm  . Radioactive seed guided mastectomy with axillary sentinel lymph node biopsy Left 07/11/2015    Procedure: INJECT BLUE DYE LEFT BREAST, RADIOACTIVE SEED GUIDED LEFT BREAST LUMPECTOMY WITH LEFT AXILLARY SENTINEL LYMPH NODE BIOPSY ;  Surgeon: Fanny Skates, MD;  Location: [facility];  Service: General;  Laterality: Left;    Social History   Social History  . Marital Status: Married    Spouse Name: N/A  . Number of Children: N/A  . Years of Education: N/A   Social History Main Topics  . Smoking status: Former Smoker -- 1.00 packs/day for 14 years    Types: Cigarettes    Quit date: 02/05/1973  . Smokeless tobacco: Never Used  . Alcohol Use: Yes     Comment: ocassional  . Drug Use: No  . Sexual Activity: Not Asked   Other Topics Concern  . None   Social History Narrative     FAMILY HISTORY:  We obtained a detailed, 4-generation family history.  Significant diagnoses are listed below: Family History  Problem  Relation Age of Onset  . Breast cancer Mother     dx. between 62-75; s/p lumpectomy  . Colon cancer Maternal Uncle 71  . Skin cancer Maternal Uncle     NOS type  . Anemia Father     later in life  . Lupus Sister   . Other Sister     hysterectomy at a "young age" for unspecified reason  . Skin cancer Maternal Grandfather     NOS type  . Skin cancer Son     NOS type - on back  . Breast cancer Sister 52    will have genetic testing soon; lives in [place]  . Multiple myeloma Sister     dx. 25-67  . Skin cancer Sister     NOS type  . Breast cancer Other 2    maternal great aunt (MGM's sister)     Ms. Hilmer has  three sons, two are fraternal twins and are currently 84 and cancer-free.  Her oldest son is 28 and has a history of skin cancer (unspecified type) on his back.  Ms. Nell has three grandsons.  She has three full sisters.  One sister died of multiple myeloma at age 35, approximately 3-4 years after diagnosis.  This sister also had a history of unspecified type of skin cancer.  This sister has two daughters and one son who are cancer-free.  Another sister is currently 53, has a history of lupus, but has not had cancer.  She had a hysterectomy at a "young age" for an unspecified reason.  The remaining sister is 3 and was diagnosed with breast cancer at 57.  This sister lives in [place], and Stacey Barrett says she is planning to have genetic testing soon.  This sister has two daughters and a son who have not had cancer.  Ms. Pund's mother died at 95.  She had a history of breast cancer diagnosed between 74-75 and treated with lumpectomy.  Ms. Mcgillicuddy's father died from age- and alcohol-related causes at 58.    Stacey Barrett mother had one full sister and one full brother.  Her sister died of rheumatic fever at age 49.  Her brother was diagnosed with colon cancer at 95 and passed away at 53.  He also had a history of skin cancer of unspecified type.  Neither of these siblings had children of their own.  Stacey Barrett's maternal grandmother died at age 71 or older.  She had one full sister who died of breast cancer at age 21 (diagnosed the year before).  Stacey Barrett's maternal grandfather died at age 69-80.  He had a history of skin cancer of unspecified type.  Stacey Barrett has limited information for other maternal great aunts/uncles or great grandparents.  She attributes the skin cancers in the family to frequent sun exposure.    Stacey Barrett's father had one full brother and one full sister. His brother died of age-related causes in his mid-27s.  He had one son who is cancer-free.  His sister died  in her mid-65s and was also cancer-free.  She had two sons who have never had cancer.  Stacey Barrett's paternal grandmother died of pneumonia at 61.  Her grandfather died of alcohol-related causes in his 89s.  Ms. King reports that her father's paternal great grandmother also had breast cancer.  She reports no known family history of genetic testing for hereditary cancer at this time.    Patient's maternal ancestors are of [place] and [place] descent, and  paternal ancestors are of [place] and [place] descent. There is no reported Ashkenazi Jewish ancestry. There is no known consanguinity.  GENETIC COUNSELING ASSESSMENT: AARIKA MOON is a 70 y.o. female with a personal and family history of breast cancer which is somewhat suggestive of a hereditary breast cancer syndrome and predisposition to cancer. We, therefore, discussed and recommended the following at today's visit.   DISCUSSION: We reviewed the characteristics, features and inheritance patterns of hereditary cancer syndromes, particularly those caused by mutations within the BRCA1/2 genes. We also discussed genetic testing, including the appropriate family members to test, the process of testing, insurance coverage and turn-around-time for results. We discussed the implications of a negative, positive and/or variant of uncertain significant result. We recommended Stacey Barrett pursue genetic testing for the 20-gene Breast/Ovarian Cancer Panel.  The Breast/Ovarian Cancer Panel offered by [facility] Hope Pigeon, MD) includes sequencing and deletion/duplication analysis for the following 19 genes:  ATM, BARD1, BRCA1, BRCA2, BRIP1, CDH1, CHEK2, FANCC, MLH1, MSH2, MSH6, NBN, PALB2, PMS2, PTEN, RAD51C, RAD51D, TP53, and XRCC2.  This panel also includes deletion/duplication analysis (without sequencing) for one gene, EPCAM.  Based on Stacey Barrett's personal and family history of cancer, she meets medical criteria for genetic testing. Despite  that she meets criteria, she may still have an out of pocket cost. We discussed that if her out of pocket cost for testing is over $100, the laboratory will call and confirm whether she wants to proceed with testing.  If the out of pocket cost of testing is less than $100 she will be billed by the genetic testing laboratory.   PLAN: After considering the risks, benefits, and limitations, Stacey Barrett  provided informed consent to pursue genetic testing and the blood sample was sent to [facility] for analysis of the 20-gene Breast/Ovarian Cancer Panel. Results should be available within approximately 2-3 weeks' time, at which point they will be disclosed by telephone to Stacey Barrett, as will any additional recommendations warranted by these results. Stacey Barrett will receive a summary of her genetic counseling visit and a copy of her results once available. This information will also be available in Epic. We encouraged Stacey Barrett to remain in contact with cancer genetics annually so that we can continuously update the family history and inform her of any changes in cancer genetics and testing that may be of benefit for her family. Stacey Barrett's questions were answered to her satisfaction today. Our contact information was provided should additional questions or concerns arise.  Thank you for the referral and allowing [place] to share in the care of your patient.   Jeanine Luz, MS, [facility] Certified Genetic Counselor [email or web address] Phone: [phone]  The patient was seen for a total of 50 minutes in face-to-face genetic counseling.  This patient was discussed with Drs. Magrinat, Lindi Adie and/or Burr Medico who agrees with the above.    _______________________________________________________________________ For Office Staff:  Number of people involved in session: 1 Was an Intern/ student involved with case: no

## 2015-07-28 ENCOUNTER — Telehealth: Payer: Self-pay | Admitting: *Deleted

## 2015-07-28 NOTE — Telephone Encounter (Signed)
Received Oncotype Dx result of 17/11%.  Placed a copy in Dr. Virgie Dad box and took one to HIM to scan.

## 2015-07-29 ENCOUNTER — Telehealth: Payer: Self-pay | Admitting: *Deleted

## 2015-07-29 NOTE — Telephone Encounter (Signed)
Spoke with patient to discuss oncotype results.  Her score is 17 and she will not require chemo.  Informed her she would get a call from radiation to schedule an appointment.

## 2015-07-31 ENCOUNTER — Other Ambulatory Visit: Payer: Self-pay | Admitting: Oncology

## 2015-08-05 ENCOUNTER — Ambulatory Visit: Payer: Medicare Other | Admitting: Oncology

## 2015-08-05 ENCOUNTER — Telehealth: Payer: Self-pay | Admitting: Genetic Counselor

## 2015-08-05 NOTE — Telephone Encounter (Signed)
Discussed with Stacey Barrett that her genetic test result was negative for known pathogenic mutations within any of 20 genes on the Breast/Ovarian Cancer Panel through [facility].  One uncertain change called, "[id number]>[id number] (p.[id number])" was found in the BRCA2 gene.  Discussed that we treat this like a negative result until it gets updated by the lab.  Stacey Barrett should continue to follow her doctors recommendations for screening.  She said her sister also had negative testing, but she is not sure whether or not she also has this VUS.  Discussed that this would be helpful to know because sometimes having multiple people in the family with breast cancer histories and the same VUS result can make the VUS more suspicious for [place].  Encouraged her to call after she finds out more from her sister.

## 2015-08-08 NOTE — Progress Notes (Signed)
Location of Breast Cancer: Left Breast  Histology per Pathology Report:  05/30/15 Diagnosis Breast, left, needle core biopsy, 5:00 o'clock, 3 CMFN - INVASIVE DUCTAL CARCINOMA 07/11/15 Diagnosis 1. Breast, lumpectomy, Left INVASIVE DUCTAL CARCINOMA, GRADE 2, SPANNING 0.8 CM DUCTAL CARCINOMA IN SITU IS PRESENT MARGINS OF RESECTION ARE NEGATIVE FOR CARCINOMA PREVIOUS BIOPSY SITE CHANGES 2. Lymph node, sentinel, biopsy, Left axillary ONE BENIGN LYMPH NODE (0/1) 3. Lymph node, sentinel, biopsy, Left axillary #2 ONE BENIGN LYMPH NODE (0/1) 4. Lymph node, sentinel, biopsy, Left axillary #3 ONE BENIGN LYMPH NODE (0/1)  Receptor Status: ER(100%), PR (95%), Her2-neu (NEG), Ki-(25%)  Did patient present with symptoms or was this found on screening mammography?: It was found on a screening mammogram.   Past/Anticipated interventions by surgeon, if any: 07/11/15 Procedure: Inject blue dye left breast, left breast lumpectomy with radioactive seed localization, left axillary sentinel lymph node biopsy Surgeon: Edsel Petrin. Dalbert Batman, M.D., [facility]  She reports she has healed well from the surgery.   Past/Anticipated interventions by medical oncology, if any:   She was seen in the Breast Clinic 5/10 by Dr. Jana Hakim and his plans included: 1) breast conserving surgery with sentinel lymph node sampling pending (done 07/11/15) (2) Oncotype to be obtained from the definitive surgical specimen (3) adjuvant radiation to follow as appropriate (4) adjuvant anti-estrogens to follow local treatment (5) genetics testing (Negative per note 08/05/15)  Note from Stacey Barrett 07/29/15 states her Oncotype score was 17, and she will not require Chemotherapy.   Lymphedema issues, if any:  She denies.   Pain issues, if any:  She denies.   SAFETY ISSUES:  Prior radiation? No  Pacemaker/ICD? No  Possible current pregnancy? N/A  Is the patient on methotrexate? No  Current  Complaints / other details:    BP 136/74 mmHg  Pulse 80  Temp(Src) 97.8 F (36.6 C)  Ht _0  (1.778 m)  Wt 165 lb 9.6 oz (75.116 kg)  BMI 23.76 kg/m2  SpO2 100%   Wt Readings from Last 3 Encounters:  08/12/15 165 lb 9.6 oz (75.116 kg)  07/11/15 164 lb (74.39 kg)  07/06/15 164 lb 3.4 oz (74.485 kg)      Stacey Barrett, [name], RN 08/08/2015,9:20 AM

## 2015-08-10 ENCOUNTER — Ambulatory Visit: Payer: Self-pay | Admitting: Genetic Counselor

## 2015-08-10 DIAGNOSIS — Z1379 Encounter for other screening for genetic and chromosomal anomalies: Secondary | ICD-10-CM

## 2015-08-10 DIAGNOSIS — Z803 Family history of malignant neoplasm of breast: Secondary | ICD-10-CM

## 2015-08-10 DIAGNOSIS — Z800 Family history of malignant neoplasm of digestive organs: Secondary | ICD-10-CM

## 2015-08-10 DIAGNOSIS — C50512 Malignant neoplasm of lower-outer quadrant of left female breast: Secondary | ICD-10-CM

## 2015-08-12 ENCOUNTER — Encounter: Payer: Self-pay | Admitting: Radiation Oncology

## 2015-08-12 ENCOUNTER — Ambulatory Visit
Admission: RE | Admit: 2015-08-12 | Discharge: 2015-08-12 | Disposition: A | Discharge status: Home / Self Care | Payer: Medicare Other | Source: Ambulatory Visit | Attending: Radiation Oncology | Admitting: Radiation Oncology

## 2015-08-12 VITALS — BP 136/74 | HR 80 | Temp 97.8°F | Ht 70.0 in | Wt 165.6 lb

## 2015-08-12 DIAGNOSIS — C50512 Malignant neoplasm of lower-outer quadrant of left female breast: Secondary | ICD-10-CM | POA: Diagnosis present

## 2015-08-12 DIAGNOSIS — Z170 Estrogen receptor positive status [ER+]: Secondary | ICD-10-CM | POA: Insufficient documentation

## 2015-08-12 DIAGNOSIS — Z510 Encounter for antineoplastic radiation therapy: Secondary | ICD-10-CM | POA: Diagnosis not present

## 2015-08-12 NOTE — Progress Notes (Signed)
Radiation Oncology         [phone] ________________________________  Initial outpatient Consultation  Name: Stacey Barrett MRN: [id number]  Date: 08/12/2015  DOB: 1945-03-10  [place]:[id number],RSWNIOEV R., MD  Fanny Skates, MD   REFERRING PHYSICIAN: Fanny Skates, MD  DIAGNOSIS:    ICD-9-CM ICD-10-CM   1. Breast cancer of lower-outer quadrant of left female breast (H[facility]) 174.5 C50.512    Stage IA pT1bN0 Left Breast LOQ Invasive Ductal Carcinoma, ER + / PR+ / Her2 negative, Grade 2  HISTORY OF PRESENT ILLNESS::Stacey Barrett is a 70 y.o. female who represents today after completing lumpectomy and sentinel lymph node biopsy on 07/11/15. Her tumor was 0.8cm and her margins were at least 41m from invasive and in situ disease. The margins to her DCIS were at least 4 mm. Her nodes were negative, path characteristics noted above in the diagnosis. She has healed well from surgery. Oncotype score was low enough to spare her from chemotherapy.   PREVIOUS RADIATION THERAPY: No   PAST MEDICAL HISTORY:  has a past medical history of Breast cancer of lower-outer quadrant of left female breast [place]); Breast cancer ([place]; Skin cancer; Hyperlipidemia; Depression; and Arthritis.    PAST SURGICAL HISTORY: Past Surgical History  Procedure Laterality Date  . Cholecystectomy    . Abdominal hysterectomy    . Facial cosmetic surgery    . Fatty tumor Left     under left arm  . Radioactive seed guided mastectomy with axillary sentinel lymph node biopsy Left 07/11/2015    Procedure: INJECT BLUE DYE LEFT BREAST, RADIOACTIVE SEED GUIDED LEFT BREAST LUMPECTOMY WITH LEFT AXILLARY SENTINEL LYMPH NODE BIOPSY ;  Surgeon: HFanny Skates MD;  [facility]  Service: General;  Laterality: Left;    FAMILY HISTORY: family history includes Anemia in her father; Breast cancer in her mother; Breast cancer (age of onset: 675 in her sister; Breast cancer (age of onset: 756 in her other; Colon cancer (age  of onset: 646 in her maternal uncle; Lupus in her sister; Multiple myeloma in her sister; Other in her sister; Skin cancer in her maternal grandfather, maternal uncle, sister, and son.  SOCIAL HISTORY:  reports that she quit smoking about 42 years ago. Her smoking use included Cigarettes. She has a 14 pack-year smoking history. She has never used smokeless tobacco. She reports that she drinks alcohol. She reports that she does not use illicit drugs. Patient owns a [employer]   ALLERGIES: Review of patient's allergies indicates no known allergies.  MEDICATIONS:  Current Outpatient Prescriptions  Medication Sig Dispense Refill  . ibuprofen (ADVIL,MOTRIN) 200 MG tablet Take 400-600 mg by mouth at bedtime as needed for moderate pain.    .Marland Kitchensertraline (ZOLOFT) 50 MG tablet Take 50 mg by mouth daily.  2  . HYDROcodone-acetaminophen (NORCO) 5-325 MG tablet Take 1-2 tablets by mouth every 6 (six) hours as needed for moderate pain or severe pain. (Patient not taking: Reported on 08/12/2015) 30 tablet 0   No current facility-administered medications for this encounter.    REVIEW OF SYSTEMS:  Notable for that above.   PHYSICAL EXAM:  height is _0  (1.778 m) and weight is 165 lb 9.6 oz (75.116 kg). Her temperature is 97.8 F (36.6 C). Her blood pressure is 136/74 and her pulse is 80. Her oxygen saturation is 100%.   General: Alert and oriented, in no acute distress  Neck: Neck is supple, no palpable cervical or supraclavicular lymphadenopathy. Heart: Regular in rate and  rhythm with no murmurs, rubs, or gallops. Chest: Clear to auscultation bilaterally, with no rhonchi, wheezes, or rales. Extremities: No cyanosis or edema. Lymphatics: see Neck Exam Skin: No concerning lesions. Musculoskeletal: symmetric strength and muscle tone throughout. Breasts: She has some postoperative swelling in the left breast, her scars have healed without any active drainage . Marland Kitchen  Patient reports that her surgical  site is healing well.    ECOG = 0  0 - Asymptomatic (Fully active, able to carry on all predisease activities without restriction)  1 - Symptomatic but completely ambulatory (Restricted in physically strenuous activity but ambulatory and able to carry out work of a light or sedentary nature. For example, light housework, office work)  2 - Symptomatic, <50% in bed during the day (Ambulatory and capable of all self care but unable to carry out any work activities. Up and about more than 50% of waking hours)  3 - Symptomatic, >50% in bed, but not bedbound (Capable of only limited self-care, confined to bed or chair 50% or more of waking hours)  4 - Bedbound (Completely disabled. Cannot carry on any self-care. Totally confined to bed or chair)  5 - Death   Eustace Pen MM, Creech RH, Tormey DC, et al. [phone]). "Toxicity and response criteria of the [facility]". [place] Oncol. 5 (6): 649-55   LABORATORY DATA:  Lab Results  Component Value Date   WBC 7.0 07/06/2015   HGB 14.1 07/06/2015   HCT 42.3 07/06/2015   MCV 89.1 07/06/2015   PLT 239 07/06/2015   CMP     Component Value Date/Time   NA 139 07/06/2015 0832   NA 139 06/15/2015 1211   K 4.0 07/06/2015 0832   K 4.2 06/15/2015 1211   CL 106 07/06/2015 0832   CO2 24 07/06/2015 0832   CO2 25 06/15/2015 1211   GLUCOSE 82 07/06/2015 0832   GLUCOSE 83 06/15/2015 1211   BUN 13 07/06/2015 0832   BUN 20.8 06/15/2015 1211   CREATININE 0.72 07/06/2015 0832   CREATININE 0.8 06/15/2015 1211   CALCIUM 9.8 07/06/2015 0832   CALCIUM 10.1 06/15/2015 1211   PROT 6.9 07/06/2015 0832   PROT 7.9 06/15/2015 1211   ALBUMIN 4.1 07/06/2015 0832   ALBUMIN 4.4 06/15/2015 1211   AST 21 07/06/2015 0832   AST 15 06/15/2015 1211   ALT 15 07/06/2015 0832   ALT 16 06/15/2015 1211   ALKPHOS 70 07/06/2015 0832   ALKPHOS 80 06/15/2015 1211   BILITOT 0.5 07/06/2015 0832   BILITOT 0.58 06/15/2015 1211   GFRNONAA >60 07/06/2015  0832   GFRAA >60 07/06/2015 7939         RADIOGRAPHY: No results found.    IMPRESSION/PLAN: Stage I Left  Breast cancer  The patient and I spoke again about the risks, benefits, and side effects of adjuvant radiotherapy to her left breast. We reviewed the randomized data specific to her disease. She would like to proceed with treatment. She understands that radiotherapy would not improve her life expectancy, but it would reduce the chance of a recurrence in her breast over her lifetime. I will order a simulation to take place in the near future, consent form has been signed and placed in her chart.    __________________________________________   Eppie Gibson, MD     This document serves as a record of services personally performed by Eppie Gibson , MD. It was created on his behalf by Truddie Hidden, a trained medical scribe. The  creation of this record is based on the scribe's personal observations and the provider's statements to them. This document has been checked and approved by the attending provider.

## 2015-08-12 NOTE — Addendum Note (Signed)
Encounter addended by: Ernst Spell, RN on: 08/12/2015  2:49 PM<BR>     Documentation filed: Charges VN

## 2015-08-19 ENCOUNTER — Ambulatory Visit
Admission: RE | Admit: 2015-08-19 | Discharge: 2015-08-19 | Disposition: A | Discharge status: Home / Self Care | Payer: Medicare Other | Source: Ambulatory Visit | Attending: Radiation Oncology | Admitting: Radiation Oncology

## 2015-08-19 DIAGNOSIS — C50512 Malignant neoplasm of lower-outer quadrant of left female breast: Secondary | ICD-10-CM

## 2015-08-19 DIAGNOSIS — Z510 Encounter for antineoplastic radiation therapy: Secondary | ICD-10-CM | POA: Diagnosis not present

## 2015-08-19 NOTE — Progress Notes (Signed)
Radiation Oncology         [phone] ________________________________  Name: Stacey Barrett MRN: [id number]  Date: 08/19/2015  DOB: [birth year]  SIMULATION AND TREATMENT PLANNING NOTE / Special treatment procedure   Outpatient  DIAGNOSIS:     ICD-9-CM ICD-10-CM   1. Breast cancer of lower-outer quadrant of left female breast ([place]) 174.5 C50.512     NARRATIVE:  The patient was brought to the [facility].  Identity was confirmed.  All relevant records and images related to the planned course of therapy were reviewed.  The patient freely provided informed written consent to proceed with treatment after reviewing the details related to the planned course of therapy. The consent form was witnessed and verified by the simulation staff.    Then, the patient was set-up in a stable reproducible supine position for radiation therapy with her ipsilateral arm over her head, and her upper body secured in a custom-made Vac-lok device.  CT images were obtained.  Surface markings were placed.  The CT images were loaded into the planning software.    Special treatment procedure was performed today due to the extra time and effort required by myself to plan and prepare this patient for deep inspiration breath hold technique.  I have determined cardiac sparing to be of benefit to this patient to prevent long term cardiac damage due to radiation of the heart.  Bellows were placed on the patient's abdomen. To facilitate cardiac sparing, the patient was coached by the radiation therapists on breath hold techniques and breathing practice was performed. Practice waveforms were obtained. The patient was then scanned while maintaining breath hold in the treatment position.  This image was then transferred over to the imaging specialist. The imaging specialist then created a fusion of the free breathing and breath hold scans using the chest wall as the stable structure. I personally reviewed the fusion  in axial, coronal and sagittal image planes.  Excellent cardiac sparing was obtained.  I felt the patient is an appropriate candidate for breath hold and the patient will be treated as such.  The image fusion was then reviewed with the patient to reinforce the necessity of reproducible breath hold.   TREATMENT PLANNING NOTE: Treatment planning then occurred.  The radiation prescription was entered and confirmed.     A total of 3 medically necessary complex treatment devices were fabricated and supervised by me: 2 fields with MLCs for custom blocks to protect heart, and lungs;  and, a Vac-lok. MORE COMPLEX DEVICES MAY BE MADE IN DOSIMETRY FOR FIELD IN FIELD BEAMS FOR DOSE HOMOGENEITY.  I have requested : 3D Simulation  I have requested a DVH of the following structures: lungs, heart, lumpectomy cavity.    The patient will receive 42.56 Gy in 16 fractions to the left breast with 2 tangential fields.   This will not be followed by a boost.  Optical Surface Tracking Plan:  Since intensity modulated radiotherapy (IMRT) and 3D conformal radiation treatment methods are predicated on accurate and precise positioning for treatment, intrafraction motion monitoring is medically necessary to ensure accurate and safe treatment delivery. The ability to quantify intrafraction motion without excessive ionizing radiation dose can only be performed with optical surface tracking. Accordingly, surface imaging offers the opportunity to obtain 3D measurements of patient position throughout IMRT and 3D treatments without excessive radiation exposure. I am ordering optical surface tracking for this patient's upcoming course of radiotherapy.  ________________________________   Reference:  Romilda Garret,  Julian Reil, et al. Surface imaging-based analysis of intrafraction motion for breast radiotherapy patients.Journal of [place], n. 6, nov. 2014. ISSN [id number].  Available at:  <http://www.jacmp.org/index.php/jacmp/article/view/4957>.    -----------------------------------  Eppie Gibson, MD

## 2015-08-21 DIAGNOSIS — Z510 Encounter for antineoplastic radiation therapy: Secondary | ICD-10-CM | POA: Diagnosis not present

## 2015-08-22 ENCOUNTER — Other Ambulatory Visit: Payer: Self-pay | Admitting: *Deleted

## 2015-08-22 DIAGNOSIS — Z510 Encounter for antineoplastic radiation therapy: Secondary | ICD-10-CM | POA: Diagnosis not present

## 2015-08-24 ENCOUNTER — Encounter: Payer: Self-pay | Admitting: Genetic Counselor

## 2015-08-26 ENCOUNTER — Ambulatory Visit
Admission: RE | Admit: 2015-08-26 | Discharge: 2015-08-26 | Disposition: A | Discharge status: Home / Self Care | Payer: Medicare Other | Source: Ambulatory Visit | Attending: Radiation Oncology | Admitting: Radiation Oncology

## 2015-08-26 DIAGNOSIS — Z510 Encounter for antineoplastic radiation therapy: Secondary | ICD-10-CM | POA: Diagnosis not present

## 2015-08-29 ENCOUNTER — Ambulatory Visit
Admission: RE | Admit: 2015-08-29 | Discharge: 2015-08-29 | Disposition: A | Discharge status: Home / Self Care | Payer: Medicare Other | Source: Ambulatory Visit | Attending: Radiation Oncology | Admitting: Radiation Oncology

## 2015-08-29 ENCOUNTER — Encounter: Payer: Self-pay | Admitting: Radiation Oncology

## 2015-08-29 VITALS — BP 127/75 | HR 85 | Temp 97.7°F | Ht 70.0 in | Wt 167.5 lb

## 2015-08-29 DIAGNOSIS — C50512 Malignant neoplasm of lower-outer quadrant of left female breast: Secondary | ICD-10-CM

## 2015-08-29 DIAGNOSIS — C50212 Malignant neoplasm of upper-inner quadrant of left female breast: Secondary | ICD-10-CM | POA: Insufficient documentation

## 2015-08-29 DIAGNOSIS — Z510 Encounter for antineoplastic radiation therapy: Secondary | ICD-10-CM | POA: Diagnosis not present

## 2015-08-29 MED ORDER — RADIAPLEXRX EX GEL
Freq: Once | CUTANEOUS | Status: AC
  Administered 2015-08-29: 15:00:00 via TOPICAL

## 2015-08-29 MED ORDER — ALRA NON-METALLIC DEODORANT (RAD-ONC)
1.0000 "application " | Freq: Once | TOPICAL | Status: AC
  Administered 2015-08-29: 1 via TOPICAL

## 2015-08-29 NOTE — Progress Notes (Signed)

## 2015-08-29 NOTE — Progress Notes (Signed)
Ms. Pivirotto is here for her first radiation treatment to her Left Breast. She denies pain or any other concerns at this time. She was provided with education today, and instructions on the use of the alra deodorant and Radiaplex cream.  BP 127/75   Pulse 85   Temp 97.7 F (36.5 C)   Ht 5\' 10"  (1.778 m)   Wt 167 lb 8 oz (76 kg)   BMI 24.03 kg/m

## 2015-08-29 NOTE — Addendum Note (Signed)
Encounter addended by: Ernst Spell, RN on: 08/29/2015  4:00 PM<BR>    Actions taken: MAR administration accepted

## 2015-08-29 NOTE — Addendum Note (Signed)
Encounter addended by: Ernst Spell, RN on: 08/29/2015  3:51 PM<BR>    Actions taken: MAR administration accepted

## 2015-08-29 NOTE — Progress Notes (Signed)
   Weekly Management Note:  outpatient    ICD-9-CM ICD-10-CM   1. Breast cancer of lower-outer quadrant of left female breast ([facility]) 174.5 C50.512     Current Dose:  2.66 Gy  Projected Dose: 42.56 Gy   Narrative:  The patient presents for routine under treatment assessment.  CBCT/MVCT images/Port film x-rays were reviewed.  The chart was checked. Doing well. Fatigued prior to RT.  Physical Findings:  height is 5\' 10"  (1.778 m) and weight is 167 lb 8 oz (76 kg). Her temperature is 97.7 F (36.5 C). Her blood pressure is 127/75 and her pulse is 85.   Wt Readings from Last 3 Encounters:  08/29/15 167 lb 8 oz (76 kg)  08/12/15 165 lb 9.6 oz (75.1 kg)  07/11/15 164 lb (74.4 kg)   No skin changes over left breast  Impression:  The patient is tolerating radiotherapy.  Plan:  Continue radiotherapy as planned. Patient instructed to apply Radiaplex in treatment fields.  ________________________________   Eppie Gibson, M.D.

## 2015-08-29 NOTE — Addendum Note (Signed)
Encounter addended by: Ernst Spell, RN on: 08/29/2015  2:22 PM<BR>    Actions taken: Diagnosis association updated, Order Entry activity accessed

## 2015-08-30 ENCOUNTER — Ambulatory Visit
Admission: RE | Admit: 2015-08-30 | Discharge: 2015-08-30 | Disposition: A | Discharge status: Home / Self Care | Payer: Medicare Other | Source: Ambulatory Visit | Attending: Radiation Oncology | Admitting: Radiation Oncology

## 2015-08-30 DIAGNOSIS — Z510 Encounter for antineoplastic radiation therapy: Secondary | ICD-10-CM | POA: Diagnosis not present

## 2015-08-31 ENCOUNTER — Ambulatory Visit
Admission: RE | Admit: 2015-08-31 | Discharge: 2015-08-31 | Disposition: A | Discharge status: Home / Self Care | Payer: Medicare Other | Source: Ambulatory Visit | Attending: Radiation Oncology | Admitting: Radiation Oncology

## 2015-08-31 DIAGNOSIS — Z510 Encounter for antineoplastic radiation therapy: Secondary | ICD-10-CM | POA: Diagnosis not present

## 2015-09-01 ENCOUNTER — Ambulatory Visit
Admission: RE | Admit: 2015-09-01 | Discharge: 2015-09-01 | Disposition: A | Discharge status: Home / Self Care | Payer: Medicare Other | Source: Ambulatory Visit | Attending: Radiation Oncology | Admitting: Radiation Oncology

## 2015-09-01 DIAGNOSIS — Z510 Encounter for antineoplastic radiation therapy: Secondary | ICD-10-CM | POA: Diagnosis not present

## 2015-09-02 ENCOUNTER — Ambulatory Visit
Admission: RE | Admit: 2015-09-02 | Discharge: 2015-09-02 | Disposition: A | Discharge status: Home / Self Care | Payer: Medicare Other | Source: Ambulatory Visit | Attending: Radiation Oncology | Admitting: Radiation Oncology

## 2015-09-02 DIAGNOSIS — Z510 Encounter for antineoplastic radiation therapy: Secondary | ICD-10-CM | POA: Diagnosis not present

## 2015-09-05 ENCOUNTER — Telehealth: Payer: Self-pay | Admitting: *Deleted

## 2015-09-05 ENCOUNTER — Ambulatory Visit
Admission: RE | Admit: 2015-09-05 | Discharge: 2015-09-05 | Disposition: A | Discharge status: Home / Self Care | Payer: Medicare Other | Source: Ambulatory Visit | Attending: Radiation Oncology | Admitting: Radiation Oncology

## 2015-09-05 ENCOUNTER — Encounter: Payer: Self-pay | Admitting: Radiation Oncology

## 2015-09-05 VITALS — BP 120/67 | HR 83 | Temp 97.9°F | Ht 70.0 in | Wt 164.8 lb

## 2015-09-05 DIAGNOSIS — Z510 Encounter for antineoplastic radiation therapy: Secondary | ICD-10-CM | POA: Diagnosis not present

## 2015-09-05 DIAGNOSIS — C50512 Malignant neoplasm of lower-outer quadrant of left female breast: Secondary | ICD-10-CM

## 2015-09-05 NOTE — Progress Notes (Signed)
   Weekly Management Note:  outpatient    ICD-9-CM ICD-10-CM   1. Breast cancer of lower-outer quadrant of left female breast ([facility]) 174.5 C50.512     Current Dose:  15.96Gy  Projected Dose: 42.56 Gy   Narrative:  The patient presents for routine under treatment assessment.  CBCT/MVCT images/Port film x-rays were reviewed.  The chart was checked. Doing well.Still somewhat fatigued.   Physical Findings:  height is 5\' 10"  (1.778 m) and weight is 164 lb 12.8 oz (74.8 kg). Her temperature is 97.9 F (36.6 C). Her blood pressure is 120/67 and her pulse is 83.   Wt Readings from Last 3 Encounters:  09/05/15 164 lb 12.8 oz (74.8 kg)  08/29/15 167 lb 8 oz (76 kg)  08/12/15 165 lb 9.6 oz (75.1 kg)   mild erythema over left breast  Impression:  The patient is tolerating radiotherapy.  Plan:  Continue radiotherapy as planned. Patient instructed to apply Radiaplex in treatment fields.  ________________________________   Eppie Gibson, M.D.

## 2015-09-05 NOTE — Telephone Encounter (Signed)
  Oncology Nurse Navigator Documentation  Navigator Location: [facility] (09/05/15 1600) Navigator Encounter Type: Telephone (09/05/15 1600) Telephone: [place] Call (09/05/15 1600)     Surgery Date: 07/11/15 (09/05/15 1600) Treatment Initiated Date: 07/11/15 (09/05/15 1600) Patient Visit Type: RadOnc (09/05/15 1600) Treatment Phase: First Radiation Tx (09/05/15 1600)                            Time Spent with Patient: 15 (09/05/15 1600)

## 2015-09-05 NOTE — Progress Notes (Signed)
Stacey Barrett presents for her 6th fraction of radiation to her Left Breast. She denies pain. She does have some fatigue and will nap in the afternoon at times. She has slight redness to her Left Breast, with swelling. She is using the Radiaplex cream twice daily.   BP 120/67   Pulse 83   Temp 97.9 F (36.6 C)   Ht 5\' 10"  (1.778 m)   Wt 164 lb 12.8 oz (74.8 kg)   BMI 23.65 kg/m

## 2015-09-06 ENCOUNTER — Ambulatory Visit
Admission: RE | Admit: 2015-09-06 | Discharge: 2015-09-06 | Disposition: A | Discharge status: Home / Self Care | Payer: Medicare Other | Source: Ambulatory Visit | Attending: Radiation Oncology | Admitting: Radiation Oncology

## 2015-09-06 DIAGNOSIS — Z510 Encounter for antineoplastic radiation therapy: Secondary | ICD-10-CM | POA: Diagnosis not present

## 2015-09-07 ENCOUNTER — Ambulatory Visit
Admission: RE | Admit: 2015-09-07 | Discharge: 2015-09-07 | Disposition: A | Discharge status: Home / Self Care | Payer: Medicare Other | Source: Ambulatory Visit | Attending: Radiation Oncology | Admitting: Radiation Oncology

## 2015-09-07 DIAGNOSIS — Z510 Encounter for antineoplastic radiation therapy: Secondary | ICD-10-CM | POA: Diagnosis not present

## 2015-09-08 ENCOUNTER — Ambulatory Visit
Admission: RE | Admit: 2015-09-08 | Discharge: 2015-09-08 | Disposition: A | Discharge status: Home / Self Care | Payer: Medicare Other | Source: Ambulatory Visit | Attending: Radiation Oncology | Admitting: Radiation Oncology

## 2015-09-08 DIAGNOSIS — Z510 Encounter for antineoplastic radiation therapy: Secondary | ICD-10-CM | POA: Diagnosis not present

## 2015-09-09 ENCOUNTER — Ambulatory Visit
Admission: RE | Admit: 2015-09-09 | Discharge: 2015-09-09 | Disposition: A | Discharge status: Home / Self Care | Payer: Medicare Other | Source: Ambulatory Visit | Attending: Radiation Oncology | Admitting: Radiation Oncology

## 2015-09-09 DIAGNOSIS — Z510 Encounter for antineoplastic radiation therapy: Secondary | ICD-10-CM | POA: Diagnosis not present

## 2015-09-12 ENCOUNTER — Ambulatory Visit
Admission: RE | Admit: 2015-09-12 | Discharge: 2015-09-12 | Disposition: A | Discharge status: Home / Self Care | Payer: Medicare Other | Source: Ambulatory Visit | Attending: Radiation Oncology | Admitting: Radiation Oncology

## 2015-09-12 VITALS — BP 114/60 | HR 76 | Temp 97.6°F | Ht 70.0 in | Wt 165.0 lb

## 2015-09-12 DIAGNOSIS — Z510 Encounter for antineoplastic radiation therapy: Secondary | ICD-10-CM

## 2015-09-12 DIAGNOSIS — C50512 Malignant neoplasm of lower-outer quadrant of left female breast: Secondary | ICD-10-CM | POA: Insufficient documentation

## 2015-09-12 MED ORDER — BIAFINE EX EMUL
Freq: Two times a day (BID) | CUTANEOUS | Status: DC
  Administered 2015-09-12: 10:00:00 via TOPICAL

## 2015-09-12 NOTE — Addendum Note (Signed)
Encounter addended by: Benn Moulder, RN on: 09/12/2015 10:07 AM<BR>    Actions taken: Vitals modified

## 2015-09-12 NOTE — Progress Notes (Addendum)
Stacey Barrett has received 11 fractions to her left breast.  Note rash like appearance in the upper, inner quadrant of her breast and in the inframmary fold with reported itching. Reports mild tenderness of the treated breast and  mild fatigue in the evenings.  ? Sonafine

## 2015-09-12 NOTE — Progress Notes (Signed)
   Weekly Management Note:  outpatient    ICD-9-CM ICD-10-CM   1. Breast cancer of lower-outer quadrant of left female breast ([facility]) 174.5 C50.512 topical emolient (BIAFINE) emulsion    Current Dose:  29.26 Gy  Projected Dose: 42.56 Gy   Narrative:  The patient presents for routine under treatment assessment.  CBCT/MVCT images/Port film x-rays were reviewed.  The chart was checked. Doing well. Some mild itching and left breast soreness.  Physical Findings: There were no vitals filed for this visit.   Wt Readings from Last 3 Encounters:  09/05/15 164 lb 12.8 oz (74.8 kg)  08/29/15 167 lb 8 oz (76 kg)  08/12/15 165 lb 9.6 oz (75.1 kg)   mild erythema over left breast with radiation dermatitis in UIQ  Impression:  The patient is tolerating radiotherapy.  Plan:  Continue radiotherapy as planned. Patient instructed to apply Sonafine and or Radiaplex in treatment fields. Apply hydrocortisone 1% cream in UIQ.  ________________________________   Eppie Gibson, M.D.

## 2015-09-12 NOTE — Addendum Note (Signed)
Encounter addended by: Benn Moulder, RN on: 09/12/2015  9:42 AM<BR>    Actions taken: [facility] administration accepted

## 2015-09-13 ENCOUNTER — Ambulatory Visit
Admission: RE | Admit: 2015-09-13 | Discharge: 2015-09-13 | Disposition: A | Discharge status: Home / Self Care | Payer: Medicare Other | Source: Ambulatory Visit | Attending: Radiation Oncology | Admitting: Radiation Oncology

## 2015-09-13 DIAGNOSIS — Z510 Encounter for antineoplastic radiation therapy: Secondary | ICD-10-CM | POA: Diagnosis not present

## 2015-09-14 ENCOUNTER — Ambulatory Visit
Admission: RE | Admit: 2015-09-14 | Discharge: 2015-09-14 | Disposition: A | Discharge status: Home / Self Care | Payer: Medicare Other | Source: Ambulatory Visit | Attending: Radiation Oncology | Admitting: Radiation Oncology

## 2015-09-14 DIAGNOSIS — Z510 Encounter for antineoplastic radiation therapy: Secondary | ICD-10-CM | POA: Diagnosis not present

## 2015-09-15 ENCOUNTER — Ambulatory Visit
Admission: RE | Admit: 2015-09-15 | Discharge: 2015-09-15 | Disposition: A | Discharge status: Home / Self Care | Payer: Medicare Other | Source: Ambulatory Visit | Attending: Radiation Oncology | Admitting: Radiation Oncology

## 2015-09-15 DIAGNOSIS — Z510 Encounter for antineoplastic radiation therapy: Secondary | ICD-10-CM | POA: Diagnosis not present

## 2015-09-16 ENCOUNTER — Ambulatory Visit
Admission: RE | Admit: 2015-09-16 | Discharge: 2015-09-16 | Disposition: A | Discharge status: Home / Self Care | Payer: Medicare Other | Source: Ambulatory Visit | Attending: Radiation Oncology | Admitting: Radiation Oncology

## 2015-09-16 DIAGNOSIS — Z510 Encounter for antineoplastic radiation therapy: Secondary | ICD-10-CM | POA: Diagnosis not present

## 2015-09-19 ENCOUNTER — Ambulatory Visit
Admission: RE | Admit: 2015-09-19 | Discharge: 2015-09-19 | Disposition: A | Discharge status: Home / Self Care | Payer: Medicare Other | Source: Ambulatory Visit | Attending: Radiation Oncology | Admitting: Radiation Oncology

## 2015-09-19 ENCOUNTER — Encounter: Payer: Self-pay | Admitting: Radiation Oncology

## 2015-09-19 ENCOUNTER — Telehealth: Payer: Self-pay | Admitting: *Deleted

## 2015-09-19 VITALS — BP 107/53 | HR 90 | Temp 98.1°F | Ht 70.0 in | Wt 163.9 lb

## 2015-09-19 DIAGNOSIS — C50512 Malignant neoplasm of lower-outer quadrant of left female breast: Secondary | ICD-10-CM

## 2015-09-19 DIAGNOSIS — Z510 Encounter for antineoplastic radiation therapy: Secondary | ICD-10-CM | POA: Diagnosis not present

## 2015-09-19 MED ORDER — RADIAPLEXRX EX GEL
Freq: Once | CUTANEOUS | Status: AC
  Administered 2015-09-19: 12:00:00 via TOPICAL

## 2015-09-19 NOTE — Telephone Encounter (Signed)
  Oncology Nurse Navigator Documentation  Navigator Location: [facility] (09/19/15 1400) Navigator Encounter Type: Telephone (09/19/15 1400) Telephone:  Call (09/19/15 1400)         Patient Visit Type: [id number] (09/19/15 1400) Treatment Phase: Final Radiation Tx (09/19/15 1400)                            Time Spent with Patient: 15 (09/19/15 1400)

## 2015-09-19 NOTE — Addendum Note (Signed)
Encounter addended by: Ernst Spell, RN on: 09/19/2015 11:17 AM<BR>    Actions taken: Order Entry activity accessed, Diagnosis association updated

## 2015-09-19 NOTE — Progress Notes (Signed)
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                         PLAN: Routine followup in one month. Patient instructed to call if questions or worsening complaints in interim.  -----------------------------------  Eppie Gibson, MD

## 2015-09-19 NOTE — Progress Notes (Signed)
Stacey Barrett is here for her last fraction of radiation to her Left Breast. She reports pain a 3/10 in her Left Breast. She reports some mild fatigue, and will nap on Sundays. Her Left Breast is red with a rash noted to the upper inner area of her Left Breast. There is a small amount of peeling noted. She reports this area itches quite a bit. She has not tried hydrocortisone cream, and will try this if needed.   BP (!) 107/53   Pulse 90   Temp 98.1 F (36.7 C)   Ht 5\' 10"  (1.778 m)   Wt 163 lb 14.4 oz (74.3 kg)   BMI 23.52 kg/m    Wt Readings from Last 3 Encounters:  09/19/15 163 lb 14.4 oz (74.3 kg)  09/12/15 165 lb (74.8 kg)  09/05/15 164 lb 12.8 oz (74.8 kg)

## 2015-09-19 NOTE — Addendum Note (Signed)
Encounter addended by: Ernst Spell, RN on: 09/19/2015 11:48 AM<BR>    Actions taken: MAR administration accepted

## 2015-09-19 NOTE — Progress Notes (Signed)
   Weekly Management Note:  outpatient    ICD-9-CM ICD-10-CM   1. Breast cancer of lower-outer quadrant of left female breast ([facility]) 174.5 C50.512     Current Dose:  42.56 Gy  Projected Dose: 42.56 Gy   Narrative:  The patient presents for routine under treatment assessment.  CBCT/MVCT images/Port film x-rays were reviewed.  The chart was checked. Doing well. Some itching in UIQ and continued mild left breast soreness.  Physical Findings:  Vitals:   09/19/15 0900  BP: (!) 107/53  Pulse: 90  Temp: 98.1 F (36.7 C)     Wt Readings from Last 3 Encounters:  09/19/15 163 lb 14.4 oz (74.3 kg)  09/12/15 165 lb (74.8 kg)  09/05/15 164 lb 12.8 oz (74.8 kg)  bright erythema over left breast with dry radiation dermatitis in UIQ  Impression:  The patient tolerated  radiotherapy.  Plan: f/u in 58mo, Apply Radiaplex in treatment fields. Apply hydrocortisone 1% cream in UIQ.  ________________________________   Eppie Gibson, M.D.

## 2015-09-29 ENCOUNTER — Ambulatory Visit (HOSPITAL_BASED_OUTPATIENT_CLINIC_OR_DEPARTMENT_OTHER): Payer: Medicare Other | Admitting: Oncology

## 2015-09-29 ENCOUNTER — Telehealth: Payer: Self-pay | Admitting: Oncology

## 2015-09-29 VITALS — BP 122/67 | HR 80 | Temp 98.4°F | Resp 16 | Ht 70.0 in | Wt 167.6 lb

## 2015-09-29 DIAGNOSIS — C50512 Malignant neoplasm of lower-outer quadrant of left female breast: Secondary | ICD-10-CM

## 2015-09-29 DIAGNOSIS — Z86711 Personal history of pulmonary embolism: Secondary | ICD-10-CM

## 2015-09-29 MED ORDER — ANASTROZOLE 1 MG PO TABS: 1.0000 mg | ORAL_TABLET | Freq: Every day | ORAL | 4 refills | Status: DC

## 2015-09-29 NOTE — Telephone Encounter (Signed)
appt made and avs printed °

## 2015-09-29 NOTE — Progress Notes (Signed)
[place]  [phone] Fax:([phone]) [phone]     ID: Stacey Barrett DOB: 06-13-1945  MR#: [id number]  PYK#:[id number]  Patient Care Team: Stacey Blade, MD as PCP - General (Internal Medicine) Stacey Cruel, MD as Consulting Physician (Oncology) Stacey Skates, MD as Consulting Physician (General Surgery) Stacey Gibson, MD as Attending Physician (Radiation Oncology) Stacey Francois, MD (Dermatology) Stacey Craver, MD as Consulting Physician (Gastroenterology) PCP: Stacey Barrett., MD GYN: OTHER MD:  CHIEF COMPLAINT: estrogen receptor positive breast cancer  CURRENT TREATMENT: Adjuvant anti-estrogens pending   BREAST CANCER HISTORY: From the original intake note:  Stacey Barrett had routine lateral mammographic screening with tomography at the [facility] 05/12/2015 showing an area of possible asymmetry in the right breast and a possible mass in the left breast. Diagnostic right mammography with tomography and left breast ultrasonography was performed 05/20/2015. Breast density was category B. The area of concern in the posterior right breast proved to be a 7 mm normal-appearing intramammary lymph node.  In the left breast however compression views showed a far posterior mass with irregular margins measuring 13 mm. This was palpable deep in the lower outer quadrant of the left breast. Ultrasonography of the left breastconfirmed a 1.6 cm hypoechoic mass with irregular margins at the 5:00 position 3 cm from the nipple. Ultrasound of the axilla showed no suspicious findings.  Biopsy of the left breast mass in question 05/30/2015 showed ([id number]) and invasive ductal carcinoma, grade 2, estrogen receptor 100% positive, addition receptor 95% positive, both with strong staining intensity, with an MIB-1 of 25%, and no HER-2 amplification, the signals ratio being 1.27 and the number per cell 5.55.  Her subsequent treatment is as detailed below   INTERVAL  HISTORY: Stacey Barrett returns today for follow-up of her estrogen receptor positive breast cancer. Since her original visit with me she underwent left lumpectomy and sentinel lymph node sampling 07/11/2015. The final pathology ([id number]) confirmed an invasive ductal carcinoma, grade 2, measuring 0.8 cm. Margins were negative and all 3 sentinel lymph nodes were clear. Repeat HER-2 was again negative, with a signals ratio of 1.56, the number per cell 1.95.  Oncotype was obtained from this tumor and it showed a score of 17, predicting a risk of recurrence outside the breast within 10 years of 11% if the patient's only systemic therapy is tamoxifen for 5 years. It also predicted no benefit from chemotherapy.  Accordingly the patient proceeded directly to radiation. She now has completed her radiation treatments and is ready to start anti-estrogens.  REVIEW OF SYSTEMS: Stacey Barrett did generally well with radiation. She did have some erythema and minimal peeling, but that is resolving. She was moderately fatigued. That has not completely resolved. She is working full time however. She walks about 10,000 steps a day just to her job and she has occasional muscle aches and pains here and there but these are not more persistent or intense than prior. A detailed review of systems today was otherwise stable.  PAST MEDICAL HISTORY: Past Medical History:  Diagnosis Date  . Arthritis   . Breast cancer ([place])   . Breast cancer of lower-outer quadrant of left female breast ([place]) 06/01/2015  . Depression   . Hyperlipidemia   . Skin cancer    05/2013    PAST SURGICAL HISTORY: Past Surgical History:  Procedure Laterality Date  . ABDOMINAL HYSTERECTOMY    . CHOLECYSTECTOMY    . FACIAL COSMETIC SURGERY    . fatty tumor Left  under left arm  . RADIOACTIVE SEED GUIDED MASTECTOMY WITH AXILLARY SENTINEL LYMPH NODE BIOPSY Left 07/11/2015   Procedure: INJECT BLUE DYE LEFT BREAST, RADIOACTIVE SEED GUIDED LEFT BREAST LUMPECTOMY  WITH LEFT AXILLARY SENTINEL LYMPH NODE BIOPSY ;  Surgeon: Stacey Skates, MD;  Location: [facility] OR;  Service: General;  Laterality: Left;    FAMILY HISTORY Family History  Problem Relation Age of Onset  . Breast cancer Mother     dx. between 12-75; s/p lumpectomy  . Colon cancer Maternal Uncle 24  . Skin cancer Maternal Uncle     NOS type  . Anemia Father     later in life  . Lupus Sister   . Other Sister     hysterectomy at a "young age" for unspecified reason  . Skin cancer Maternal Grandfather     NOS type  . Skin cancer Son     NOS type - on back  . Breast cancer Sister 76    will have genetic testing soon; lives in [place]  . Multiple myeloma Sister     dx. 59-67  . Skin cancer Sister     NOS type  . Breast cancer Other 22    maternal great aunt (MGM's sister)   the patient's father died at the age of 26. The patient's mother died at the age of 72. Stacey Barrett had no brothers, 3 sisters. Her mother was diagnosed with breast cancer at age 58. A maternal uncle was diagnosed with colon cancer. In addition one of Stacey Barrett's sisters was diagnosed with breast cancer at age 17. A second sister has a history of multiple myeloma.  GYNECOLOGIC HISTORY:  No LMP recorded. Patient has had a hysterectomy. Menarche age 62, first live birth age 49, she is [place] P3. She underwent total abdominal hysterectomy with bilateral salpingo-oophorectomy age 42. She took hormone replacement about 3 months and then developed a pulmonary embolus. However she has a history of approximately 8 years on oral contraceptives remotelywith no clots or other complications.  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 She is widowed and lives alone with a standard [name] [place] terrier. Her sons Stacey Barrett and Stacey Barrett living [place] and also worked in the [employer]. Son Stacey Barrett lives in [place] and he is a Development worker, international aid. The patient has 3 grandchildren. She is not a church attender    ADVANCED  DIRECTIVES: in place. The patient's son Stacey Barrett is her healthcare power of attorney. He can be reached at [phone].   HEALTH MAINTENANCE: Social History  Substance Use Topics  . Smoking status: Former Smoker    Packs/day: 1.00    Years: 14.00    Types: Cigarettes    Quit date: 02/05/1973  . Smokeless tobacco: Never Used  . Alcohol use Yes     Comment: ocassional     Colonoscopy:  PAP:  Bone density:  Lipid panel:  No Known Allergies  Current Outpatient Prescriptions  Medication Sig Dispense Refill  . anastrozole (ARIMIDEX) 1 MG tablet Take 1 tablet (1 mg total) by mouth daily. 90 tablet 4  . ibuprofen (ADVIL,MOTRIN) 200 MG tablet Take 400-600 mg by mouth at bedtime as needed for moderate pain.     No current facility-administered medications for this visit.     OBJECTIVE: middle-aged white woman Who appears stated age 70:   09/29/15 0942  BP: 122/67  Pulse: 80  Resp: 16  Temp: 98.4 F (36.9 C)     Body mass index is  24.05 kg/m.    ECOG FS:1 - Symptomatic but completely ambulatory  Sclerae unicteric, pupils round and equal Oropharynx clear and moist-- no thrush or other lesions No cervical or supraclavicular adenopathy Lungs no rales or rhonchi Heart regular rate and rhythm Abd soft, nontender, positive bowel sounds MSK no focal spinal tenderness, no upper extremity lymphedema Neuro: nonfocal, well oriented, appropriate affect Breasts: the right breast is unremarkable; the left breast is s/p lumpectomy and radiation. There is minimal residual erythema. There is no desquamation. There is no evidence of local recurrence. The cosmetic result is good. The left axilla is benign.    LAB RESULTS:  CMP     Component Value Date/Time   NA 139 07/06/2015 0832   NA 139 06/15/2015 1211   K 4.0 07/06/2015 0832   K 4.2 06/15/2015 1211   CL 106 07/06/2015 0832   CO2 24 07/06/2015 0832   CO2 25 06/15/2015 1211   GLUCOSE 82 07/06/2015 0832   GLUCOSE 83  06/15/2015 1211   BUN 13 07/06/2015 0832   BUN 20.8 06/15/2015 1211   CREATININE 0.72 07/06/2015 0832   CREATININE 0.8 06/15/2015 1211   CALCIUM 9.8 07/06/2015 0832   CALCIUM 10.1 06/15/2015 1211   PROT 6.9 07/06/2015 0832   PROT 7.9 06/15/2015 1211   ALBUMIN 4.1 07/06/2015 0832   ALBUMIN 4.4 06/15/2015 1211   AST 21 07/06/2015 0832   AST 15 06/15/2015 1211   ALT 15 07/06/2015 0832   ALT 16 06/15/2015 1211   ALKPHOS 70 07/06/2015 0832   ALKPHOS 80 06/15/2015 1211   BILITOT 0.5 07/06/2015 0832   BILITOT 0.58 06/15/2015 1211   GFRNONAA >60 07/06/2015 0832   GFRAA >60 07/06/2015 0832    INo results found for: SPEP, UPEP  Lab Results  Component Value Date   WBC 7.0 07/06/2015   NEUTROABS 3.2 07/06/2015   HGB 14.1 07/06/2015   HCT 42.3 07/06/2015   MCV 89.1 07/06/2015   PLT 239 07/06/2015      Chemistry      Component Value Date/Time   NA 139 07/06/2015 0832   NA 139 06/15/2015 1211   K 4.0 07/06/2015 0832   K 4.2 06/15/2015 1211   CL 106 07/06/2015 0832   CO2 24 07/06/2015 0832   CO2 25 06/15/2015 1211   BUN 13 07/06/2015 0832   BUN 20.8 06/15/2015 1211   CREATININE 0.72 07/06/2015 0832   CREATININE 0.8 06/15/2015 1211      Component Value Date/Time   CALCIUM 9.8 07/06/2015 0832   CALCIUM 10.1 06/15/2015 1211   ALKPHOS 70 07/06/2015 0832   ALKPHOS 80 06/15/2015 1211   AST 21 07/06/2015 0832   AST 15 06/15/2015 1211   ALT 15 07/06/2015 0832   ALT 16 06/15/2015 1211   BILITOT 0.5 07/06/2015 0832   BILITOT 0.58 06/15/2015 1211       No results found for: LABCA2  No components found for: [id number]  No results for input(s): INR in the last 168 hours.  Urinalysis    Component Value Date/Time   COLORURINE RED (A) 01/13/2014 0925   APPEARANCEUR TURBID (A) 01/13/2014 0925   LABSPEC 1.037 (H) 01/13/2014 0925   PHURINE 5.5 01/13/2014 0925   GLUCOSEU NEGATIVE 01/13/2014 0925   HGBUR LARGE (A) 01/13/2014 0925   BILIRUBINUR SMALL (A) 01/13/2014 0925    KETONESUR NEGATIVE 01/13/2014 0925   PROTEINUR >300 (A) 01/13/2014 0925   UROBILINOGEN 1.0 01/13/2014 0925   NITRITE NEGATIVE 01/13/2014 0925   LEUKOCYTESUR SMALL (  A) 01/13/2014 0925      ELIGIBLE FOR AVAILABLE RESEARCH PROTOCOL: no  STUDIES: No results found.  ASSESSMENT: 70 y.o. [place] woman status post left breast lower outer quadrant Biopsy 05/20/2015 for a clinical T1c N0, stage IA invasive ductal carcinoma, grade 2, strongly estrogen and progesterone receptor positive, HER-2 nonamplified, with an MIB-1 of 25%  (1) left lumpectomy and sentinel lymph node sampling 07/11/2015 showed a pT1b pN0, stage IA invasive ductal carcinoma, grade 2, with repeat HER-2 again negative  (2) Oncotype DX score of 17 predicts a risk of recurrence outside the breast within 10 years of 11% if the patient's only systemic therapy is tamoxifen for 5 years. It also predicts no benefit from chemotherapy.  (3) adjuvant radiation 08-29-15  To 09-19-15:   Left breast / 42.56 Gy in 16 fractions                       (4) anastrozole started 09/29/2015.  (5) genetics testing 07/22/2015 through the [place] offered by [facility] found no deleterious mutations in ATM, BARD1, BRCA1, BRCA2, BRIP1, CDH1, CHEK2, EPCAM, FANCC, MLH1, MSH2, MSH6, NBN, PALB2, PMS2, PTEN, RAD51C, RAD51D, TP53, and XRCC2  (a) Onevariant of uncertain significance (VUS) called, "c.8351G>A (p.Arg2784Gln)"  was found in BRCA2   PLAN: Stacey Barrett has completed the local treatment for her breast cancer and is now ready to start systemic therapy. We discussed the results of her Oncotype, which tells [place] if she takes tamoxifen for 5 years she would have an 89% chance of this cancer not recurring within the next 10 years outside of the breast.  We then discussed in detail the difference between tamoxifen and the aromatase inhibitors. She understands that anastrozole and the aromatase inhibitors in general work by blocking  estrogen production. Accordingly vaginal dryness, decrease in bone density, and of course hot flashes can result. The aromatase inhibitors can also negatively affect the cholesterol profile, although that is a minor effect. One out of 5 women on aromatase inhibitors we will feel "old and achy". This arthralgia/myalgia syndrome, which resembles fibromyalgia clinically, does resolve with stopping the medications. Accordingly this is not a reason to not try an aromatase inhibitor but it is a frequent reason to stop it (in other words 20% of women will not be able to tolerate these medications).  Tamoxifen on the other hand does not block estrogen production. It does not "take away a woman's estrogen". It blocks the estrogen receptor in breast cells. Like anastrozole, it can also cause hot flashes. As opposed to anastrozole, tamoxifen has many estrogen-like effects. It is technically an estrogen receptor modulator. This means that in some tissues tamoxifen works like estrogen-- for example it helps strengthen the bones. It tends to improve the cholesterol profile. It can cause thickening of the endometrial lining, and even endometrial polyps or rarely cancer of the uterus.(The risk of uterine cancer due to tamoxifen is one additional cancer per thousand women year). It can cause vaginal wetness or stickiness. It can cause blood clots through this estrogen-like effect--the risk of blood clots with tamoxifen is exactly the same as with birth control pills or hormone replacement.  Neither of these agents causes mood changes or weight gain, despite the popular belief that they can have these side effects. We have data from studies comparing either of these drugs with placebo, and in those cases the control group had the same amount of weight gain and depression as the group that took the  drug.  After all this discussion, particularly because of concerns regarding blood clots given her history of a pulmonary embolus,  we are going to start anastrozole. She has not had a bone density in some years and we are obtaining that through the [facility] Center next month. I have also asked her to start vitamin D supplementation. I will check a vitamin D level before her next visit here. She really has an excellent walking program.  If she tolerates anastrozole well the plan will be to do that for 5 years. This means her chance of this cancer not recurring outside the breast within 10 years will be in the 91-92% range.  She will call with any problems that may develop before the next time I see her, which will be late October.     Stacey Cruel, MD   09/29/2015 10:07 AM Medical Oncology and Hematology [facility] [address] [place], [place] [address] Tel. [phone]    Fax. [phone]

## 2015-10-21 ENCOUNTER — Ambulatory Visit: Admission: RE | Admit: 2015-10-21 | Payer: Medicare Other | Source: Ambulatory Visit | Admitting: Radiation Oncology

## 2015-10-31 DIAGNOSIS — Z1379 Encounter for other screening for genetic and chromosomal anomalies: Secondary | ICD-10-CM | POA: Insufficient documentation

## 2015-10-31 NOTE — Progress Notes (Signed)
GENETIC TEST RESULT  HPI: Ms. Rutten was previously seen in the [place] clinic due to a personal and family history of breast cancer and concerns regarding a hereditary predisposition to cancer. Please refer to our prior cancer genetics clinic note from July 21, 2015 for more information regarding Ms. Repsher's medical, social and family histories, and our assessment and recommendations, at the time. Ms. Juran's recent genetic test results were disclosed to her, as were recommendations warranted by these results. These results and recommendations are discussed in more detail below.  GENETIC TEST RESULTS: At the time of Ms. Inglett's visit on 07/21/15, we recommended she pursue genetic testing of the 20-gene Breast/Ovarian Cancer Panel through [facility]. The Breast/Ovarian Cancer Panel offered by [facility] Hope Pigeon, MD) includes sequencing and deletion/duplication analysis for the following 19 genes:  ATM, BARD1, BRCA1, BRCA2, BRIP1, CDH1, CHEK2, FANCC, MLH1, MSH2, MSH6, NBN, PALB2, PMS2, PTEN, RAD51C, RAD51D, TP53, and XRCC2.  This panel also includes deletion/duplication analysis (without sequencing) for one gene, EPCAM.  Those results are now back, the report date for which is August 05, 2015.  Genetic testing was normal, and did not reveal a deleterious mutation in these genes.  One variant of uncertain significance (VUS) was found in the BRCA2 gene.  The test report will be scanned into EPIC and will be located under the Results Review tab in the Pathology>Molecular Pathology section.   Genetic testing did identify a variant of uncertain significance (VUS) called "c.8351G>A (p.Arg2784Gln)" in one copy of the BRCA2 gene. At this time, it is unknown if this VUS is associated with an increased risk for cancer or if this is a normal finding. Since this VUS result is uncertain, it cannot help guide screening recommendations, and family members should not be  tested for this VUS to help define their own cancer risks.  Also, we all have variants within our genes that make [place] unique individuals--most of these variants are benign.  Thus, we treat this VUS as a negative result, until it gets updated by the lab.   With time, we suspect the lab will reclassify this variant and when they do, we will try to re-contact Ms. Carducci to discuss the reclassification further.  We also encouraged Ms. Coffel to contact us in a year or two to obtain an update on the status of this VUS.  We discussed with Ms. Fontanilla that since the current genetic testing is not perfect, it is possible there may be a gene mutation in one of these genes that current testing cannot detect, but that chance is small. We also discussed, that it is possible that another gene that has not yet been discovered, or that we have not yet tested, is responsible for the cancer diagnoses in the family, and it is, therefore, important to remain in touch with cancer genetics in the future so that we can continue to offer Ms. Henne the most up-to-date genetic testing.   CANCER SCREENING RECOMMENDATIONS: While we still do not have an explanation for the personal and family history of breast cancer, this result is reassuring and indicates that Ms. Kriegel likely does not have an increased risk for a future cancer due to a mutation in one of these genes. This normal test also suggests that Ms. Dyar's cancer was most likely not due to an inherited predisposition associated with one of these genes.  Most cancers happen by chance and this negative test suggests that her cancer falls into this category.  Additionally, all of the breast cancer in the family was diagnosed at later ages.  We, therefore, recommended she continue to follow the cancer management and screening guidelines provided by her oncology and primary healthcare providers.   RECOMMENDATIONS FOR FAMILY MEMBERS: Women in this family might  be at some increased risk of developing cancer, over the general population risk, simply due to the family history of cancer. We recommended women in this family have a yearly mammogram beginning at age 53, or 52 years younger than the earliest onset of cancer, an annual clinical breast exam, and perform monthly breast self-exams. Women in this family should also have a gynecological exam as recommended by their primary provider. All family members should have a colonoscopy by age 81.  Based on Ms. Labrada's family history, we recommended her sister, who was diagnosed with breast cancer at age 24, have genetic counseling and testing.  Ms. Pilkenton reports that this sister has now had genetic testing now (in [place]) and that her sister's result was negative, however, she is not sure whether or not her sister had this same BRCA2 VUS.  We encouraged her to call back and let us know, once she finds out more about her sister's test result.  FOLLOW-UP: Lastly, we discussed with Ms. Pennisi that cancer genetics is a rapidly advancing field and it is possible that new genetic tests will be appropriate for her and/or her family members in the future. We encouraged her to remain in contact with cancer genetics on an annual basis so we can update her personal and family histories and let her know of advances in cancer genetics that may benefit this family.   Our contact number was provided. Ms. Lehnert's questions were answered to her satisfaction, and she knows she is welcome to call us at anytime with additional questions or concerns.   Jeanine Luz, MS, [facility] Certified Genetic Counselor [email or web address] Phone: [phone]

## 2015-11-04 ENCOUNTER — Ambulatory Visit
Admission: RE | Admit: 2015-11-04 | Discharge: 2015-11-04 | Disposition: A | Discharge status: Home / Self Care | Payer: Medicare Other | Source: Ambulatory Visit | Attending: Radiation Oncology | Admitting: Radiation Oncology

## 2015-11-04 ENCOUNTER — Encounter: Payer: Self-pay | Admitting: Radiation Oncology

## 2015-11-04 DIAGNOSIS — Z79811 Long term (current) use of aromatase inhibitors: Secondary | ICD-10-CM | POA: Diagnosis not present

## 2015-11-04 DIAGNOSIS — Z79899 Other long term (current) drug therapy: Secondary | ICD-10-CM | POA: Insufficient documentation

## 2015-11-04 DIAGNOSIS — Z7982 Long term (current) use of aspirin: Secondary | ICD-10-CM | POA: Diagnosis not present

## 2015-11-04 DIAGNOSIS — C50512 Malignant neoplasm of lower-outer quadrant of left female breast: Secondary | ICD-10-CM | POA: Insufficient documentation

## 2015-11-04 NOTE — Progress Notes (Signed)
Stacey Barrett presents for follow up of radiation completed 09/19/15 to her Left Breast. She denies pain. She does report continued fatigue. She has started taking anastrozole. Her Left Breast has healed. She still has "bumps" to her upper outer quadrant of her Left Breast. She tells me they do not itch. She is still using the Radiaplex cream, and will switch to a Vitamin E cream when finished.   BP 118/64   Pulse 88   Temp 98.2 F (36.8 C)   Ht 5\' 10"  (1.778 m)   Wt 168 lb 12.8 oz (76.6 kg)   SpO2 98% Comment: room air  BMI 24.22 kg/m    Wt Readings from Last 3 Encounters:  11/04/15 168 lb 12.8 oz (76.6 kg)  09/29/15 167 lb 9.6 oz (76 kg)  09/19/15 163 lb 14.4 oz (74.3 kg)

## 2015-11-04 NOTE — Progress Notes (Signed)
  Radiation Oncology         [phone] ________________________________  Name: Stacey Barrett MRN: [id number]  Date: 11/04/2015  DOB: 11/16/1945  Follow-Up Visit Note  Outpatient  CC: Salena Saner., MD  Magrinat, Virgie Dad, MD  Diagnosis and Prior Radiotherapy:    ICD-9-CM ICD-10-CM   1. Breast cancer of lower-outer quadrant of left female breast ([place]) 174.5 C50.512     Radiation completed on 09/19/15 to her left breast  Narrative:  The patient returns today for routine follow-up.  Stacey Barrett reports continued fatigue. She denies pain. She has started taking anastrozole. Her left breast has healed. She still has bumps to her upper outer quadrant of her left breast. She states they do not itch. She is still using radiaplex cream, and will switch to a Vitamin E cream when finished.                             ALLERGIES:  has No Known Allergies.  Meds: Current Outpatient Prescriptions  Medication Sig Dispense Refill  . anastrozole (ARIMIDEX) 1 MG tablet Take 1 tablet (1 mg total) by mouth daily. 90 tablet 4  . aspirin EC 81 MG tablet Take 81 mg by mouth daily.    Marland Kitchen ibuprofen (ADVIL,MOTRIN) 200 MG tablet Take 400-600 mg by mouth at bedtime as needed for moderate pain.     No current facility-administered medications for this encounter.     Physical Findings: The patient is in no acute distress. Patient is alert and oriented.  height is 5\' 10"  (1.778 m) and weight is 168 lb 12.8 oz (76.6 kg). Her temperature is 98.2 F (36.8 C). Her blood pressure is 118/64 and her pulse is 88. Her oxygen saturation is 98%. .    Breast: She has some residual dryness over her left breast but the pigment has normalized  Lab Findings: Lab Results  Component Value Date   WBC 7.0 07/06/2015   HGB 14.1 07/06/2015   HCT 42.3 07/06/2015   MCV 89.1 07/06/2015   PLT 239 07/06/2015    Radiographic Findings: No results found.  Impression/Plan: healing well. Apply Vit E lotion or oil to  skin. I encouraged her to continue with yearly mammography and followup with medical oncology. I will see her back on an as-needed basis. I have encouraged her to call if she has any issues or concerns in the future. I wished her the very best.  _____________________________________   Eppie Gibson, MD This document serves as a record of services personally performed by Eppie Gibson, MD. It was created on her behalf by Bethann Humble, a trained medical scribe. The creation of this record is based on the scribe's personal observations and the provider's statements to them. This document has been checked and approved by the attending provider.

## 2015-11-24 ENCOUNTER — Other Ambulatory Visit (HOSPITAL_BASED_OUTPATIENT_CLINIC_OR_DEPARTMENT_OTHER): Payer: Medicare Other

## 2015-11-24 DIAGNOSIS — C50512 Malignant neoplasm of lower-outer quadrant of left female breast: Secondary | ICD-10-CM | POA: Diagnosis not present

## 2015-11-24 LAB — COMPREHENSIVE METABOLIC PANEL
ALBUMIN: 4 g/dL (ref 3.5–5.0)
ALT: 13 U/L (ref 0–55)
AST: 15 U/L (ref 5–34)
Alkaline Phosphatase: 74 U/L (ref 40–150)
Anion Gap: 10 mEq/L (ref 3–11)
BUN: 23.3 mg/dL (ref 7.0–26.0)
CHLORIDE: 106 meq/L (ref 98–109)
CO2: 25 meq/L (ref 22–29)
Calcium: 9.8 mg/dL (ref 8.4–10.4)
Creatinine: 0.8 mg/dL (ref 0.6–1.1)
EGFR: 73 mL/min/{1.73_m2} — AB (ref >90)
GLUCOSE: 96 mg/dL (ref 70–140)
POTASSIUM: 4.3 meq/L (ref 3.5–5.1)
SODIUM: 142 meq/L (ref 136–145)
Total Bilirubin: 0.54 mg/dL (ref 0.20–1.20)
Total Protein: 7.2 g/dL (ref 6.4–8.3)

## 2015-11-24 LAB — CBC WITH DIFFERENTIAL/PLATELET
BASO%: 1.2 % (ref 0.0–2.0)
Basophils Absolute: 0.1 10*3/uL (ref 0.0–0.1)
EOS%: 8.2 % — AB (ref 0.0–7.0)
Eosinophils Absolute: 0.5 10*3/uL (ref 0.0–0.5)
HCT: 42.3 % (ref 34.8–46.6)
HEMOGLOBIN: 14.4 g/dL (ref 11.6–15.9)
LYMPH%: 26 % (ref 14.0–49.7)
MCH: 31 pg (ref 25.1–34.0)
MCHC: 34 g/dL (ref 31.5–36.0)
MCV: 91 fL (ref 79.5–101.0)
MONO#: 0.6 10*3/uL (ref 0.1–0.9)
MONO%: 11 % (ref 0.0–14.0)
NEUT%: 53.6 % (ref 38.4–76.8)
NEUTROS ABS: 3.1 10*3/uL (ref 1.5–6.5)
Platelets: 250 10*3/uL (ref 145–400)
RBC: 4.65 10*6/uL (ref 3.70–5.45)
RDW: 14 % (ref 11.2–14.5)
WBC: 5.7 10*3/uL (ref 3.9–10.3)
lymph#: 1.5 10*3/uL (ref 0.9–3.3)

## 2015-11-25 LAB — VITAMIN D 25 HYDROXY (VIT D DEFICIENCY, FRACTURES): Vitamin D, 25-Hydroxy: 41.7 ng/mL (ref 30.0–100.0)

## 2015-12-01 ENCOUNTER — Ambulatory Visit (HOSPITAL_BASED_OUTPATIENT_CLINIC_OR_DEPARTMENT_OTHER): Payer: Medicare Other | Admitting: Oncology

## 2015-12-01 VITALS — BP 132/65 | HR 79 | Temp 98.1°F | Resp 18 | Ht 70.0 in | Wt 168.3 lb

## 2015-12-01 DIAGNOSIS — R5383 Other fatigue: Secondary | ICD-10-CM

## 2015-12-01 DIAGNOSIS — Z170 Estrogen receptor positive status [ER+]: Secondary | ICD-10-CM

## 2015-12-01 DIAGNOSIS — G4700 Insomnia, unspecified: Secondary | ICD-10-CM | POA: Diagnosis not present

## 2015-12-01 DIAGNOSIS — C50512 Malignant neoplasm of lower-outer quadrant of left female breast: Secondary | ICD-10-CM

## 2015-12-01 DIAGNOSIS — M8580 Other specified disorders of bone density and structure, unspecified site: Secondary | ICD-10-CM

## 2015-12-01 DIAGNOSIS — N393 Stress incontinence (female) (male): Secondary | ICD-10-CM

## 2015-12-01 NOTE — Progress Notes (Signed)
[place]  [phone] [phone]     ID: WAVERLY TARQUINIO DOB: 1945-02-14  MR#: [id number]  LOV#:[id number]  Patient Care Team: Willey Blade, MD as PCP - General (Internal Medicine) Chauncey Cruel, MD as Consulting Physician (Oncology) Fanny Skates, MD as Consulting Physician (General Surgery) Eppie Gibson, MD as Attending Physician (Radiation Oncology) Kennieth Francois, MD (Dermatology) Juanita Craver, MD as Consulting Physician (Gastroenterology) PCP: Salena Saner., MD GYN: OTHER MD:  CHIEF COMPLAINT: estrogen receptor positive breast cancer  CURRENT TREATMENT: Anastrozole   BREAST CANCER HISTORY: From the original intake note:  Shavelle had routine lateral mammographic screening with tomography at the [facility] 72/07/2015 showing an area of possible asymmetry in the right breast and a possible mass in the left breast. Diagnostic right mammography with tomography and left breast ultrasonography was performed 05/20/2015. Breast density was category B. The area of concern in the posterior right breast proved to be a 7 mm normal-appearing intramammary lymph node.  In the left breast however compression views showed a far posterior mass with irregular margins measuring 13 mm. This was palpable deep in the lower outer quadrant of the left breast. Ultrasonography of the left breastconfirmed a 1.6 cm hypoechoic mass with irregular margins at the 5:00 position 3 cm from the nipple. Ultrasound of the axilla showed no suspicious findings.  Biopsy of the left breast mass in question 05/30/2015 showed ([id number]) and invasive ductal carcinoma, grade 2, estrogen receptor 100% positive, addition receptor 95% positive, both with strong staining intensity, with an MIB-1 of 25%, and no HER-2 amplification, the signals ratio being 1.27 and the number per cell 5.55.  Her subsequent treatment is as detailed below   INTERVAL HISTORY: Mirra returns  today for follow-up of her estrogen receptor positive breast cancer. She has been on anastrozole for a little over 2 months. She tolerates it "okay". She feels a little tired in the afternoons, but has no significant problems with hot flashes, or vaginal dryness. She has not experienced any arthralgias or myalgias from this medication. She obtains it at less than $5 per month.  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 She has difficulty falling asleep but once she does fall asleep she does not wake up even though her dogs sleep in the bed with her. She has mild stress urinary incontinence symptoms and is wearing a pad. She has mild joint pains here and there which are not more intense or persistent than before. Occasional she has mild "twinges" of discomfort in the left breast. Aside from these issues a detailed review of systems today was noncontributory  PAST MEDICAL HISTORY: Past Medical History:  Diagnosis Date  . Arthritis   . Breast cancer ([place])   . Breast cancer of lower-outer quadrant of left female breast ([place]) 06/01/2015  . Depression   . Hyperlipidemia   . Skin cancer    05/2013    PAST SURGICAL HISTORY: Past Surgical History:  Procedure Laterality Date  . ABDOMINAL HYSTERECTOMY    . CHOLECYSTECTOMY    . FACIAL COSMETIC SURGERY    . fatty tumor Left    under left arm  . RADIOACTIVE SEED GUIDED MASTECTOMY WITH AXILLARY SENTINEL LYMPH NODE BIOPSY Left 07/11/2015   Procedure: INJECT BLUE DYE LEFT BREAST, RADIOACTIVE SEED GUIDED LEFT BREAST LUMPECTOMY WITH LEFT AXILLARY SENTINEL LYMPH NODE BIOPSY ;  Surgeon: Fanny Skates, MD;  Location: [facility];  Service: General;  Laterality: Left;    FAMILY HISTORY Family History  Problem Relation Age of Onset  . Breast cancer Mother     dx. between 54-75; s/p lumpectomy  . Colon cancer Maternal Uncle 82  . Skin cancer Maternal Uncle     NOS type  .  Anemia Father     later in life  . Lupus Sister   . Other Sister     hysterectomy at a "young age" for unspecified reason  . Skin cancer Maternal Grandfather     NOS type  . Skin cancer Son     NOS type - on back  . Breast cancer Sister 64    will have genetic testing soon; lives in [place]  . Multiple myeloma Sister     dx. 68-67  . Skin cancer Sister     NOS type  . Breast cancer Other 85    maternal great aunt (MGM's sister)   the patient's father died at the age of 1. The patient's mother died at the age of 51. Jacobi had no brothers, 3 sisters. Her mother was diagnosed with breast cancer at age 57. A maternal uncle was diagnosed with colon cancer. In addition one of Elynn's sisters was diagnosed with breast cancer at age 5. A second sister has a history of multiple myeloma.  GYNECOLOGIC HISTORY:  No LMP recorded. Patient has had a hysterectomy. Menarche age 53, first live birth age 25, she is [place] P3. She underwent total abdominal hysterectomy with bilateral salpingo-oophorectomy age 23. She took hormone replacement about 3 months and then developed a pulmonary embolus. However she has a history of approximately 8 years on oral contraceptives remotelywith no clots or other complications.  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 She is widowed and lives alone with a standard [name] [place] terrier. Her sons [name] and [name] living [place] and also worked in the [employer]. Son [name] lives in [place] and he is a Development worker, international aid. The patient has 3 grandchildren. She is not a church attender    ADVANCED DIRECTIVES: in place. The patient's son [name] is her healthcare power of attorney. He can be reached at [phone].   HEALTH MAINTENANCE: Social History  Substance Use Topics  . Smoking status: Former Smoker    Packs/day: 1.00    Years: 14.00    Types: Cigarettes    Quit date: 02/05/1973  . Smokeless tobacco: Never Used  . Alcohol use Yes       Comment: ocassional     Colonoscopy:  PAP:  Bone density:  Lipid panel:  No Known Allergies  Current Outpatient Prescriptions  Medication Sig Dispense Refill  . anastrozole (ARIMIDEX) 1 MG tablet Take 1 tablet (1 mg total) by mouth daily. 90 tablet 4  . aspirin EC 81 MG tablet Take 81 mg by mouth daily.    Marland Kitchen ibuprofen (ADVIL,MOTRIN) 200 MG tablet Take 400-600 mg by mouth at bedtime as needed for moderate pain.     No current facility-administered medications for this visit.     OBJECTIVE: middle-aged white woman In no acute distress Vitals:   12/01/15 1348  BP: 132/65  Pulse: 79  Resp: 18  Temp: 98.1 F (36.7 C)     Body mass index is 24.15 kg/m.    ECOG FS:1 - Symptomatic but completely ambulatory  Sclerae unicteric, EOMs intact Oropharynx clear and moist No cervical or supraclavicular adenopathy Lungs no rales or rhonchi Heart regular  rate and rhythm Abd soft, nontender, positive bowel sounds MSK no focal spinal tenderness, no upper extremity lymphedema Neuro: nonfocal, well oriented, appropriate affect Breasts: The right breast is benign. The left breast is status post lumpectomy and radiation. There is no evidence of local recurrence. The left axilla is benign.   LAB RESULTS:  CMP     Component Value Date/Time   NA 142 11/24/2015 0752   K 4.3 11/24/2015 0752   CL 106 07/06/2015 0832   CO2 25 11/24/2015 0752   GLUCOSE 96 11/24/2015 0752   BUN 23.3 11/24/2015 0752   CREATININE 0.8 11/24/2015 0752   CALCIUM 9.8 11/24/2015 0752   PROT 7.2 11/24/2015 0752   ALBUMIN 4.0 11/24/2015 0752   AST 15 11/24/2015 0752   ALT 13 11/24/2015 0752   ALKPHOS 74 11/24/2015 0752   BILITOT 0.54 11/24/2015 0752   GFRNONAA >60 07/06/2015 0832   GFRAA >60 07/06/2015 0832    INo results found for: SPEP, UPEP  Lab Results  Component Value Date   WBC 5.7 11/24/2015   NEUTROABS 3.1 11/24/2015   HGB 14.4 11/24/2015   HCT 42.3 11/24/2015   MCV 91.0 11/24/2015   PLT  250 11/24/2015      Chemistry      Component Value Date/Time   NA 142 11/24/2015 0752   K 4.3 11/24/2015 0752   CL 106 07/06/2015 0832   CO2 25 11/24/2015 0752   BUN 23.3 11/24/2015 0752   CREATININE 0.8 11/24/2015 0752      Component Value Date/Time   CALCIUM 9.8 11/24/2015 0752   ALKPHOS 74 11/24/2015 0752   AST 15 11/24/2015 0752   ALT 13 11/24/2015 0752   BILITOT 0.54 11/24/2015 0752       No results found for: LABCA2  No components found for: LABCA125  No results for input(s): INR in the last 168 hours.  Urinalysis    Component Value Date/Time   COLORURINE RED (A) 01/13/2014 0925   APPEARANCEUR TURBID (A) 01/13/2014 0925   LABSPEC 1.037 (H) 01/13/2014 0925   PHURINE 5.5 01/13/2014 0925   GLUCOSEU NEGATIVE 01/13/2014 0925   HGBUR LARGE (A) 01/13/2014 0925   BILIRUBINUR SMALL (A) 01/13/2014 0925   KETONESUR NEGATIVE 01/13/2014 0925   PROTEINUR >300 (A) 01/13/2014 0925   UROBILINOGEN 1.0 01/13/2014 0925   NITRITE NEGATIVE 01/13/2014 0925   LEUKOCYTESUR SMALL (A) 01/13/2014 0925      ELIGIBLE FOR AVAILABLE RESEARCH PROTOCOL: no  STUDIES: No results found.  ASSESSMENT: 70 y.o. [place] woman status post left breast lower outer quadrant Biopsy 05/20/2015 for a clinical T1c N0, stage IA invasive ductal carcinoma, grade 2, strongly estrogen and progesterone receptor positive, HER-2 nonamplified, with an MIB-1 of 25%  (1) left lumpectomy and sentinel lymph node sampling 07/11/2015 showed a pT1b pN0, stage IA invasive ductal carcinoma, grade 2, with repeat HER-2 again negative  (2) Oncotype DX score of 17 predicts a risk of recurrence outside the breast within 10 years of 11% if the patient's only systemic therapy is tamoxifen for 5 years. It also predicts no benefit from chemotherapy.  (3) adjuvant radiation 08-29-15  To 09-19-15:   Left breast / 42.56 Gy in 16 fractions                       (4) anastrozole started 09/29/2015.  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  She has never had a bone density. I have put in the order for her to have this done in January when she has her next set of mammograms at the [facility].  We discussed sleep and fatigue issues. I think she has good sleep habits except for the fact that her dogs are in bed with her. There is no problem with her taking Benadryl to help her fall asleep.  As far as the fatigue is concerned that more active she is less fatigued she will find herself to be  Finally she does have some stress urinary incontinence. She is going to try avoiding every 2 hours to keep the bladder small and starting Kegel exercises. If that does not work we will consider referral to urology.  She will see me again in 6 months. She knows to call for any problems that may develop before that visit.   Chauncey Cruel, MD   12/01/2015 3:13 PM Medical Oncology and Hematology [facility] [address] [place], [place] [address] Tel. [phone]    Fax. [phone]

## 2016-04-12 ENCOUNTER — Encounter (HOSPITAL_COMMUNITY): Payer: Self-pay

## 2016-05-09 ENCOUNTER — Other Ambulatory Visit: Payer: Self-pay | Admitting: Internal Medicine

## 2016-05-09 DIAGNOSIS — E2839 Other primary ovarian failure: Secondary | ICD-10-CM

## 2016-05-17 ENCOUNTER — Ambulatory Visit
Admission: RE | Admit: 2016-05-17 | Discharge: 2016-05-17 | Disposition: A | Discharge status: Home / Self Care | Payer: Medicare Other | Source: Ambulatory Visit | Attending: Internal Medicine | Admitting: Internal Medicine

## 2016-05-17 DIAGNOSIS — E2839 Other primary ovarian failure: Secondary | ICD-10-CM

## 2016-05-31 ENCOUNTER — Ambulatory Visit (HOSPITAL_BASED_OUTPATIENT_CLINIC_OR_DEPARTMENT_OTHER): Payer: Medicare Other | Admitting: Oncology

## 2016-05-31 ENCOUNTER — Other Ambulatory Visit (HOSPITAL_BASED_OUTPATIENT_CLINIC_OR_DEPARTMENT_OTHER): Payer: Medicare Other

## 2016-05-31 VITALS — BP 127/59 | HR 84 | Temp 98.0°F | Resp 18 | Ht 70.0 in | Wt 165.2 lb

## 2016-05-31 DIAGNOSIS — C50512 Malignant neoplasm of lower-outer quadrant of left female breast: Secondary | ICD-10-CM

## 2016-05-31 DIAGNOSIS — R05 Cough: Secondary | ICD-10-CM | POA: Diagnosis not present

## 2016-05-31 DIAGNOSIS — Z170 Estrogen receptor positive status [ER+]: Secondary | ICD-10-CM | POA: Diagnosis not present

## 2016-05-31 LAB — CBC WITH DIFFERENTIAL/PLATELET
BASO%: 0.8 % (ref 0.0–2.0)
Basophils Absolute: 0.1 10*3/uL (ref 0.0–0.1)
EOS ABS: 0.2 10*3/uL (ref 0.0–0.5)
EOS%: 2.7 % (ref 0.0–7.0)
HCT: 40.6 % (ref 34.8–46.6)
HEMOGLOBIN: 14.2 g/dL (ref 11.6–15.9)
LYMPH#: 2.3 10*3/uL (ref 0.9–3.3)
LYMPH%: 28.3 % (ref 14.0–49.7)
MCH: 31.2 pg (ref 25.1–34.0)
MCHC: 35 g/dL (ref 31.5–36.0)
MCV: 89.1 fL (ref 79.5–101.0)
MONO#: 0.7 10*3/uL (ref 0.1–0.9)
MONO%: 8 % (ref 0.0–14.0)
NEUT%: 60.2 % (ref 38.4–76.8)
NEUTROS ABS: 4.9 10*3/uL (ref 1.5–6.5)
PLATELETS: 314 10*3/uL (ref 145–400)
RBC: 4.56 10*6/uL (ref 3.70–5.45)
RDW: 13.7 % (ref 11.2–14.5)
WBC: 8.2 10*3/uL (ref 3.9–10.3)

## 2016-05-31 LAB — COMPREHENSIVE METABOLIC PANEL
ALT: 14 U/L (ref 0–55)
ANION GAP: 11 meq/L (ref 3–11)
AST: 12 U/L (ref 5–34)
Albumin: 4.2 g/dL (ref 3.5–5.0)
Alkaline Phosphatase: 85 U/L (ref 40–150)
BUN: 18.9 mg/dL (ref 7.0–26.0)
CALCIUM: 10 mg/dL (ref 8.4–10.4)
CHLORIDE: 104 meq/L (ref 98–109)
CO2: 26 mEq/L (ref 22–29)
Creatinine: 0.8 mg/dL (ref 0.6–1.1)
EGFR: 74 mL/min/{1.73_m2} — AB (ref >90)
Glucose: 107 mg/dl (ref 70–140)
POTASSIUM: 3.9 meq/L (ref 3.5–5.1)
Sodium: 141 mEq/L (ref 136–145)
Total Bilirubin: 0.52 mg/dL (ref 0.20–1.20)
Total Protein: 7.2 g/dL (ref 6.4–8.3)

## 2016-05-31 MED ORDER — ANASTROZOLE 1 MG PO TABS: 1.0000 mg | ORAL_TABLET | Freq: Every day | ORAL | 4 refills | Status: DC

## 2016-05-31 MED ORDER — VITAMIN D 1000 UNITS PO TABS: 1000.0000 [IU] | ORAL_TABLET | Freq: Every day | ORAL | 4 refills | Status: AC

## 2016-05-31 NOTE — Progress Notes (Signed)
[facility]  [phone] [phone]     ID: Stacey Barrett DOB: 1945-12-20  MR#: [id number]  FAO#:[id number]  Patient Care Team: Stacey Blade, MD as PCP - General (Internal Medicine) Stacey Cruel, MD as Consulting Physician (Oncology) Stacey Skates, MD as Consulting Physician (General Surgery) Stacey Gibson, MD as Attending Physician (Radiation Oncology) Stacey Francois, MD (Dermatology) Stacey Craver, MD as Consulting Physician (Gastroenterology) PCP: Stacey Barrett., MD GYN: OTHER MD:  CHIEF COMPLAINT: estrogen receptor positive breast cancer  CURRENT TREATMENT: Anastrozole   BREAST CANCER HISTORY: From the original intake note:  Stacey Barrett had routine lateral mammographic screening with tomography at the [facility] 05/12/2015 showing an area of possible asymmetry in the right breast and a possible mass in the left breast. Diagnostic right mammography with tomography and left breast ultrasonography was performed 05/20/2015. Breast density was category B. The area of concern in the posterior right breast proved to be a 7 mm normal-appearing intramammary lymph node.  In the left breast however compression views showed a far posterior mass with irregular margins measuring 13 mm. This was palpable deep in the lower outer quadrant of the left breast. Ultrasonography of the left breastconfirmed a 1.6 cm hypoechoic mass with irregular margins at the 5:00 position 3 cm from the nipple. Ultrasound of the axilla showed no suspicious findings.  Biopsy of the left breast mass in question 05/30/2015 showed ([id number]) and invasive ductal carcinoma, grade 2, estrogen receptor 100% positive, addition receptor 95% positive, both with strong staining intensity, with an MIB-1 of 25%, and no HER-2 amplification, the signals ratio being 1.27 and the number per cell 5.55.  Her subsequent treatment is as detailed below   INTERVAL HISTORY: Stacey Barrett returns  today for follow-up of her estrogen receptor positive breast cancer. She continues on anastrozole, generally with good tolerance.Hot flashes and vaginal dryness are not a major issue. She never developed the arthralgias or myalgias that many patients can experience on this medication. She obtains it at a good price.  REVIEW OF SYSTEMS: Stacey Barrett has had an intermittent dry cough, which she thought might be related to the anastrozole but is much more likely to be seasonal allergies. She feels mildly fatigued. Aside from these issues a detailed review of systems today was noncontributory  PAST MEDICAL HISTORY: Past Medical History:  Diagnosis Date  . Arthritis   . Breast cancer ([place])   . Breast cancer of lower-outer quadrant of left female breast ([place]) 06/01/2015  . Depression   . Hyperlipidemia   . Skin cancer    05/2013    PAST SURGICAL HISTORY: Past Surgical History:  Procedure Laterality Date  . ABDOMINAL HYSTERECTOMY    . CHOLECYSTECTOMY    . FACIAL COSMETIC SURGERY    . fatty tumor Left    under left arm  . RADIOACTIVE SEED GUIDED MASTECTOMY WITH AXILLARY SENTINEL LYMPH NODE BIOPSY Left 07/11/2015   Procedure: INJECT BLUE DYE LEFT BREAST, RADIOACTIVE SEED GUIDED LEFT BREAST LUMPECTOMY WITH LEFT AXILLARY SENTINEL LYMPH NODE BIOPSY ;  Surgeon: Stacey Skates, MD;  Location: [facility] OR;  Service: General;  Laterality: Left;    FAMILY HISTORY Family History  Problem Relation Age of Onset  . Breast cancer Mother     dx. between 29-75; s/p lumpectomy  . Colon cancer Maternal Uncle 43  . Skin cancer Maternal Uncle     NOS type  . Anemia Father     later in life  . Lupus Sister   . Other  Sister     hysterectomy at a "young age" for unspecified reason  . Skin cancer Maternal Grandfather     NOS type  . Skin cancer Son     NOS type - on back  . Breast cancer Sister 108    will have genetic testing soon; lives in [place]  . Multiple myeloma Sister     dx. 71-67  . Skin cancer Sister      NOS type  . Breast cancer Other 72    maternal great aunt (MGM's sister)   the patient's father died at the age of 1. The patient's mother died at the age of 53. Stacey Barrett had no brothers, 3 sisters. Her mother was diagnosed with breast cancer at age 78. A maternal uncle was diagnosed with colon cancer. In addition one of [name]'s sisters was diagnosed with breast cancer at age 11. A second sister has a history of multiple myeloma.  GYNECOLOGIC HISTORY:  No LMP recorded. Patient has had a hysterectomy. Menarche age 47, first live birth age 48, she is Stacey Barrett. She underwent total abdominal hysterectomy with bilateral salpingo-oophorectomy age 60. She took hormone replacement about 3 months and then developed a pulmonary embolus. However she has a history of approximately 8 years on oral contraceptives remotelywith no clots or other complications.  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 She is widowed and lives alone with a standard [name] [place] terrier. Her sons Stacey Barrett and Stacey Barrett living [place] and also worked in the [employer]. Son Stacey Barrett lives in [place] and he is a Development worker, international aid. The patient has 3 grandchildren. She is not a church attender    ADVANCED DIRECTIVES: in place. The patient's son Stacey Barrett is her healthcare power of attorney. He can be reached at [phone].   HEALTH MAINTENANCE: Social History  Substance Use Topics  . Smoking status: Former Smoker    Packs/day: 1.00    Years: 14.00    Types: Cigarettes    Quit date: 02/05/1973  . Smokeless tobacco: Never Used  . Alcohol use Yes     Comment: ocassional     Colonoscopy:  PAP:  Bone density:  Lipid panel:  No Known Allergies  Current Outpatient Prescriptions  Medication Sig Dispense Refill  . anastrozole (ARIMIDEX) 1 MG tablet Take 1 tablet (1 mg total) by mouth daily. 90 tablet 4  . aspirin EC 81 MG tablet Take 81 mg by mouth daily.    Marland Kitchen ibuprofen (ADVIL,MOTRIN) 200 MG tablet Take  400-600 mg by mouth at bedtime as needed for moderate pain.     No current facility-administered medications for this visit.     OBJECTIVE: middle-aged white woman IWould appears well  Vitals:   05/31/16 1446  BP: (!) 127/59  Pulse: 84  Resp: 18  Temp: 98 F (36.7 C)     Body mass index is 23.7 kg/m.    ECOG FS:0 - Asymptomatic  Sclerae unicteric, pupils round and equal Oropharynx clear and moist No cervical or supraclavicular adenopathy Lungs no rales or rhonchi Heart regular rate and rhythm Abd soft, nontender, positive bowel sounds MSK no focal spinal tenderness, no upper extremity lymphedema Neuro: nonfocal, well oriented, appropriate affect Breasts: The right breast is unremarkable. The left breast is status post lumpectomy followed by radiation, with no evidence of local recurrence. Both axillae are benign.   LAB RESULTS:  CMP     Component Value Date/Time   NA 142 11/24/2015 0752  K 4.3 11/24/2015 0752   CL 106 07/06/2015 0832   CO2 25 11/24/2015 0752   GLUCOSE 96 11/24/2015 0752   BUN 23.3 11/24/2015 0752   CREATININE 0.8 11/24/2015 0752   CALCIUM 9.8 11/24/2015 0752   PROT 7.2 11/24/2015 0752   ALBUMIN 4.0 11/24/2015 0752   AST 15 11/24/2015 0752   ALT 13 11/24/2015 0752   ALKPHOS 74 11/24/2015 0752   BILITOT 0.54 11/24/2015 0752   GFRNONAA >60 07/06/2015 0832   GFRAA >60 07/06/2015 0832    INo results found for: SPEP, UPEP  Lab Results  Component Value Date   WBC 8.2 05/31/2016   NEUTROABS 4.9 05/31/2016   HGB 14.2 05/31/2016   HCT 40.6 05/31/2016   MCV 89.1 05/31/2016   PLT 314 05/31/2016      Chemistry      Component Value Date/Time   NA 142 11/24/2015 0752   K 4.3 11/24/2015 0752   CL 106 07/06/2015 0832   CO2 25 11/24/2015 0752   BUN 23.3 11/24/2015 0752   CREATININE 0.8 11/24/2015 0752      Component Value Date/Time   CALCIUM 9.8 11/24/2015 0752   ALKPHOS 74 11/24/2015 0752   AST 15 11/24/2015 0752   ALT 13 11/24/2015 0752     BILITOT 0.54 11/24/2015 0752       No results found for: LABCA2  No components found for: LABCA125  No results for input(s): INR in the last 168 hours.  Urinalysis    Component Value Date/Time   COLORURINE RED (A) 01/13/2014 0925   APPEARANCEUR TURBID (A) 01/13/2014 0925   LABSPEC 1.037 (H) 01/13/2014 0925   PHURINE 5.5 01/13/2014 0925   GLUCOSEU NEGATIVE 01/13/2014 0925   HGBUR LARGE (A) 01/13/2014 0925   BILIRUBINUR SMALL (A) 01/13/2014 0925   KETONESUR NEGATIVE 01/13/2014 0925   PROTEINUR >300 (A) 01/13/2014 0925   UROBILINOGEN 1.0 01/13/2014 0925   NITRITE NEGATIVE 01/13/2014 0925   LEUKOCYTESUR SMALL (A) 01/13/2014 0925      ELIGIBLE FOR AVAILABLE RESEARCH PROTOCOL: no  STUDIES: Dg Bone Density (dxa)  Result Date: 05/17/2016 EXAM: DUAL X-RAY ABSORPTIOMETRY (DXA) FOR BONE MINERAL DENSITY IMPRESSION: Referring Physician:  Willey Barrett PATIENT: Name: Stacey Barrett, Stacey Barrett Patient ID: [id number] Birth Date: September 15, 1945 Height: 70.0 in. Sex: Female Measured: 05/17/2016 Weight: 168.3 lbs. Indications: Advanced Age, Alcohol (3 or more units per day), Arimidex, Bilateral Ovariectomy (65.51), Breast Cancer History, Caucasian, Depression, Estrogen Deficient, Height Loss (781.91), High risk medication use, Hx of tobacco use, Hysterectomy, Low Calcium Intake (269.3), Postmenopausal, Zoloft, Secondary Osteoporosis Fractures: None Treatments: Hormone Therapy For Cancer ASSESSMENT: The BMD measured at Forearm Radius 33% is 0.767 g/cm2 with a T-score of -1.4. This patient is considered osteopenic according to [facility]) criteria. Lumbar spine was not utilized due to advanced degenerative changes. Per the official positions of the ISCD, it is not possible to quantitatively compare BMD or calculate an [facility] between exams done at different facilities or on different devices. Site Region Measured Date Measured Age YA BMD Significant CHANGE T-score Left Forearm Radius 33%  05/17/2016 72.8 -1.4 0.767 g/cm2 DualFemur Neck Right 05/17/2016 72.8 -1.1 0.884 g/cm2 World Health Organization [facility]) criteria for post-menopausal, Caucasian Women: Normal       T-score at or above -1 SD Osteopenia   T-score between -1 and -2.5 SD Osteoporosis T-score at or below -2.5 SD RECOMMENDATION: [place] recommends that FDA-approved medical therapies be considered in postmenopausal women and men age 93 or older with a:  1. Hip or vertebral (clinical or morphometric) fracture. 2. T-score of <-2.5 at the spine or hip. 3. Ten-year fracture probability by FRAX of 3% or greater for hip fracture or 20% or greater for major osteoporotic fracture. All treatment decisions require clinical judgment and consideration of individual patient factors, including patient preferences, co-morbidities, previous drug use, risk factors not captured in the FRAX model (e.g. falls, vitamin D deficiency, increased bone turnover, interval significant decline in bone density) and possible under - or over-estimation of fracture risk by FRAX. All patients should ensure an adequate intake of dietary calcium (1200 mg/d) and vitamin D (800 IU daily) unless contraindicated. FOLLOW-UP: People with diagnosed cases of osteoporosis or at high risk for fracture should have regular bone mineral density tests. For patients eligible for Medicare, routine testing is allowed once every 2 years. The testing frequency can be increased to one year for patients who have rapidly progressing disease, those who are receiving or discontinuing medical therapy to restore bone mass, or have additional risk factors. I have reviewed this report, and agree with the above findings. [facility] FRAX* 10-year Probability of Fracture Based on femoral neck BMD: DualFemur (Right) Major Osteoporotic Fracture: 11.6% Hip Fracture:                2.0% Population:                  [place] (Caucasian) Risk Factors: Alcohol (3 or more units per  day), Secondary Osteoporosis *FRAX is a [employer] for Metabolic Bone Disease, a [place] (WHO) [facility]. ASSESSMENT: The probability of a major osteoporotic fracture is 11.6% within the next ten years. The probability of a hip fracture is 2.0% within the next ten years. Electronically Signed   By: Marijo Conception, M.D.   On: 05/17/2016 09:11    ASSESSMENT: 71 y.o. [place] woman status post left breast lower outer quadrant Biopsy 05/20/2015 for a clinical T1c N0, stage IA invasive ductal carcinoma, grade 2, strongly estrogen and progesterone receptor positive, HER-2 nonamplified, with an MIB-1 of 25%  (1) left lumpectomy and sentinel lymph node sampling 07/11/2015 showed a pT1b pN0, stage IA invasive ductal carcinoma, grade 2, with repeat HER-2 again negative  (2) Oncotype DX score of 17 predicts a risk of recurrence outside the breast within 10 years of 11% if the patient's only systemic therapy is tamoxifen for 5 years. It also predicts no benefit from chemotherapy.  (3) adjuvant radiation 08-29-15  To 09-19-15:   Left breast / 42.56 Gy in 16 fractions                       (4) anastrozole started 09/29/2015.  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 This is favorable.  The plan is to continue anastrozole for a total of 5 years. Her bone density is favorable. She does need to optimize her vitamin D  intake fortunately her job involves quite a bit of  walking and lifting which will also increase her bone density.  At this point I'm comfortable seeing her on a once a year basis. She knows to call for any problems that may develop before her next visit.     Stacey Cruel, MD   05/31/2016 2:48 PM Medical Oncology and Hematology [facility] [address] [place], [place] [address] Tel. [phone]    Fax. [phone]

## 2017-05-21 ENCOUNTER — Other Ambulatory Visit: Payer: Self-pay | Admitting: Internal Medicine

## 2017-05-21 DIAGNOSIS — C50912 Malignant neoplasm of unspecified site of left female breast: Secondary | ICD-10-CM

## 2017-05-27 ENCOUNTER — Inpatient Hospital Stay: Payer: Medicare Other | Attending: Oncology

## 2017-05-27 DIAGNOSIS — Z170 Estrogen receptor positive status [ER+]: Secondary | ICD-10-CM | POA: Diagnosis not present

## 2017-05-27 DIAGNOSIS — C50512 Malignant neoplasm of lower-outer quadrant of left female breast: Secondary | ICD-10-CM

## 2017-05-27 LAB — COMPREHENSIVE METABOLIC PANEL
ALBUMIN: 4.2 g/dL (ref 3.5–5.0)
ALT: 13 U/L (ref 0–55)
AST: 14 U/L (ref 5–34)
Alkaline Phosphatase: 81 U/L (ref 40–150)
Anion gap: 10 (ref 3–11)
BILIRUBIN TOTAL: 0.4 mg/dL (ref 0.2–1.2)
BUN: 16 mg/dL (ref 7–26)
CO2: 20 mmol/L — AB (ref 22–29)
Calcium: 10 mg/dL (ref 8.4–10.4)
Chloride: 107 mmol/L (ref 98–109)
Creatinine, Ser: 0.79 mg/dL (ref 0.60–1.10)
GFR calc Af Amer: >60 mL/min (ref >60)
GFR calc non Af Amer: >60 mL/min (ref >60)
GLUCOSE: 94 mg/dL (ref 70–140)
POTASSIUM: 3.9 mmol/L (ref 3.5–5.1)
SODIUM: 137 mmol/L (ref 136–145)
TOTAL PROTEIN: 7.3 g/dL (ref 6.4–8.3)

## 2017-05-27 LAB — CBC WITH DIFFERENTIAL/PLATELET
BASOS ABS: 0.1 10*3/uL (ref 0.0–0.1)
BASOS PCT: 1 %
EOS ABS: 0.3 10*3/uL (ref 0.0–0.5)
Eosinophils Relative: 6 %
HCT: 42.9 % (ref 34.8–46.6)
HEMOGLOBIN: 14.8 g/dL (ref 11.6–15.9)
Lymphocytes Relative: 30 %
Lymphs Abs: 1.8 10*3/uL (ref 0.9–3.3)
MCH: 30.9 pg (ref 25.1–34.0)
MCHC: 34.5 g/dL (ref 31.5–36.0)
MCV: 89.6 fL (ref 79.5–101.0)
Monocytes Absolute: 0.5 10*3/uL (ref 0.1–0.9)
Monocytes Relative: 9 %
NEUTROS PCT: 54 %
Neutro Abs: 3.3 10*3/uL (ref 1.5–6.5)
Platelets: 260 10*3/uL (ref 145–400)
RBC: 4.79 MIL/uL (ref 3.70–5.45)
RDW: 14.2 % (ref 11.2–14.5)
WBC: 6 10*3/uL (ref 3.9–10.3)

## 2017-05-28 ENCOUNTER — Telehealth: Payer: Self-pay | Admitting: Oncology

## 2017-05-28 NOTE — Telephone Encounter (Signed)
Patient called to reschedule  °

## 2017-06-03 ENCOUNTER — Ambulatory Visit
Admission: RE | Admit: 2017-06-03 | Discharge: 2017-06-03 | Disposition: A | Discharge status: Home / Self Care | Payer: Medicare Other | Source: Ambulatory Visit | Attending: Internal Medicine | Admitting: Internal Medicine

## 2017-06-03 ENCOUNTER — Other Ambulatory Visit: Payer: Self-pay | Admitting: Internal Medicine

## 2017-06-03 ENCOUNTER — Ambulatory Visit: Payer: Medicare Other | Admitting: Oncology

## 2017-06-03 DIAGNOSIS — R0781 Pleurodynia: Secondary | ICD-10-CM

## 2017-06-11 NOTE — Progress Notes (Signed)
[facility]  [phone] [phone]     ID: KENNY STERN DOB: 1945/02/14  MR#: [id number]  GUR#:[id number]  Patient Care Team: Willey Blade, MD as PCP - General (Internal Medicine) Ciel Chervenak, Virgie Dad, MD as Consulting Physician (Oncology) Fanny Skates, MD as Consulting Physician (General Surgery) Eppie Gibson, MD as Attending Physician (Radiation Oncology) Dasher, Rayvon Char, MD (Dermatology) Juanita Craver, MD as Consulting Physician (Gastroenterology) PCP: Willey Blade, MD GYN: OTHER MD:  CHIEF COMPLAINT: estrogen receptor positive breast cancer  CURRENT TREATMENT: Anastrozole   BREAST CANCER HISTORY: From the original intake note:  Mahira had routine lateral mammographic screening with tomography at the [facility] 05/12/2015 showing an area of possible asymmetry in the right breast and a possible mass in the left breast. Diagnostic right mammography with tomography and left breast ultrasonography was performed 05/20/2015. Breast density was category B. The area of concern in the posterior right breast proved to be a 7 mm normal-appearing intramammary lymph node.  In the left breast however compression views showed a far posterior mass with irregular margins measuring 13 mm. This was palpable deep in the lower outer quadrant of the left breast. Ultrasonography of the left breastconfirmed a 1.6 cm hypoechoic mass with irregular margins at the 5:00 position 3 cm from the nipple. Ultrasound of the axilla showed no suspicious findings.  Biopsy of the left breast mass in question 05/30/2015 showed ([id number]) and invasive ductal carcinoma, grade 2, estrogen receptor 100% positive, addition receptor 95% positive, both with strong staining intensity, with an MIB-1 of 25%, and no HER-2 amplification, the signals ratio being 1.27 and the number per cell 5.55.  Her subsequent treatment is as detailed below   INTERVAL HISTORY: Khamila returns  today for follow-up of her estrogen receptor positive breast cancer. She continues on anastrozole, with good tolerance. She denies issues with hot flashes. She has some vaginal dryness, but it isn't a major issue.  Since her last visit, she underwent diagnostic bilateral mammography with CAD and tomography and right ultrasonography on 06/14/2017 at [facility] showing: breast density category B.There were 1-2 palpable areas resulting from a fall which showed: Probable fat necrosis in the 10 o'clock location of the RIGHT breast warranting of close follow-up. She is scheduled for follow up on 09/17/2017.   She also had right chest rib xray on 06/03/2017 after a fall, which showed: Patchy right lower lobe airspace disease, possible pneumonia or Contusion. Nondisplaced rib right sixth rib anterior laterally.   REVIEW OF SYSTEMS: Princess reports that she broke her ribs due to a fall about 2 weeks ago. She denies having SOB.  She works out with a Fish farm manager for exercise. She denies unusual headaches, visual changes, nausea, vomiting, or dizziness. There has been no unusual cough, phlegm production, or pleurisy. This been no change in bowel or bladder habits. She denies unexplained fatigue or unexplained weight loss, bleeding, rash, or fever. A detailed review of systems was otherwise stable.    PAST MEDICAL HISTORY: Past Medical History:  Diagnosis Date  . Arthritis   . Breast cancer ([place])   . Breast cancer of lower-outer quadrant of left female breast ([place]) 06/01/2015  . Depression   . Hyperlipidemia   . Personal history of radiation therapy   . Skin cancer    05/2013    PAST SURGICAL HISTORY: Past Surgical History:  Procedure Laterality Date  . ABDOMINAL HYSTERECTOMY    . BREAST BIOPSY    . BREAST LUMPECTOMY Left  2017  . CHOLECYSTECTOMY    . FACIAL COSMETIC SURGERY    . fatty tumor Left    under left arm  . RADIOACTIVE SEED GUIDED PARTIAL MASTECTOMY WITH AXILLARY SENTINEL  LYMPH NODE BIOPSY Left 07/11/2015   Procedure: INJECT BLUE DYE LEFT BREAST, RADIOACTIVE SEED GUIDED LEFT BREAST LUMPECTOMY WITH LEFT AXILLARY SENTINEL LYMPH NODE BIOPSY ;  Surgeon: Fanny Skates, MD;  Location: [facility] OR;  Service: General;  Laterality: Left;    FAMILY HISTORY Family History  Problem Relation Age of Onset  . Breast cancer Mother        dx. between 82-75; s/p lumpectomy  . Colon cancer Maternal Uncle 25  . Skin cancer Maternal Uncle        NOS type  . Anemia Father        later in life  . Lupus Sister   . Other Sister        hysterectomy at a "young age" for unspecified reason  . Skin cancer Maternal Grandfather        NOS type  . Skin cancer Son        NOS type - on back  . Breast cancer Sister 45       will have genetic testing soon; lives in [place]  . Multiple myeloma Sister        dx. 47-67  . Skin cancer Sister        NOS type  . Breast cancer Other 19       maternal great aunt (MGM's sister)   the patient's father died at the age of 82. The patient's mother died at the age of 39. Ahlia had no brothers, 3 sisters. Her mother was diagnosed with breast cancer at age 55. A maternal uncle was diagnosed with colon cancer. In addition one of Ranita's sisters was diagnosed with breast cancer at age 49. A second sister has a history of multiple myeloma.  GYNECOLOGIC HISTORY:  No LMP recorded. Patient has had a hysterectomy. Menarche age 79, first live birth age 61, she is [place] P3. She underwent total abdominal hysterectomy with bilateral salpingo-oophorectomy age 62. She took hormone replacement about 3 months and then developed a pulmonary embolus. However she has a history of approximately 8 years on oral contraceptives remotelywith no clots or other complications.  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 She is widowed and lives alone with a standard [name] [place] terrier. Her sons [name] and [name] living [place] and also worked in the [employer]. Son  [name] lives in [place] and he is a Development worker, international aid. The patient has 3 grandchildren. She is not a church attender    ADVANCED DIRECTIVES: in place. The patient's son [name] is her healthcare power of attorney. He can be reached at [phone].   HEALTH MAINTENANCE: Social History   Tobacco Use  . Smoking status: Former Smoker    Packs/day: 1.00    Years: 14.00    Pack years: 14.00    Types: Cigarettes    Last attempt to quit: 02/05/1973    Years since quitting: 44.3  . Smokeless tobacco: Never Used  Substance Use Topics  . Alcohol use: Yes    Comment: ocassional  . Drug use: No     Colonoscopy:  PAP:  Bone density:  Lipid panel:  No Known Allergies  Current Outpatient Medications  Medication Sig Dispense Refill  . anastrozole (ARIMIDEX) 1 MG tablet Take 1 tablet (1 mg total) by  mouth daily. 90 tablet 4  . aspirin EC 81 MG tablet Take 81 mg by mouth daily.    . cholecalciferol (VITAMIN D) 1000 units tablet Take 1 tablet (1,000 Units total) by mouth daily. 100 tablet 4  . ibuprofen (ADVIL,MOTRIN) 200 MG tablet Take 400-600 mg by mouth at bedtime as needed for moderate pain.     No current facility-administered medications for this visit.     OBJECTIVE: middle-aged white woman in no acute distress  Vitals:   06/18/17 1012  BP: 139/71  Pulse: 85  Resp: 18  Temp: 97.6 F (36.4 C)  SpO2: 99%     Body mass index is 23.76 kg/m.    ECOG FS:1 - Symptomatic but completely ambulatory  Sclerae unicteric, EOMs intact Oropharynx clear and moist No cervical or supraclavicular adenopathy Lungs no rales or rhonchi Heart regular rate and rhythm Abd soft, nontender, positive bowel sounds MSK no focal spinal tenderness, no upper extremity lymphedema Neuro: nonfocal, well oriented, appropriate affect Breasts: The right breast is status post recent trauma.  There is a small ecchymosis.  There is no palpable mass.  The left breast is status post lumpectomy  and radiation with no evidence of local recurrence.  Both axillae are benign.  LAB RESULTS:  CMP     Component Value Date/Time   NA 137 05/27/2017 0839   NA 141 05/31/2016 1400   K 3.9 05/27/2017 0839   K 3.9 05/31/2016 1400   CL 107 05/27/2017 0839   CO2 20 (L) 05/27/2017 0839   CO2 26 05/31/2016 1400   GLUCOSE 94 05/27/2017 0839   GLUCOSE 107 05/31/2016 1400   BUN 16 05/27/2017 0839   BUN 18.9 05/31/2016 1400   CREATININE 0.79 05/27/2017 0839   CREATININE 0.8 05/31/2016 1400   CALCIUM 10.0 05/27/2017 0839   CALCIUM 10.0 05/31/2016 1400   PROT 7.3 05/27/2017 0839   PROT 7.2 05/31/2016 1400   ALBUMIN 4.2 05/27/2017 0839   ALBUMIN 4.2 05/31/2016 1400   AST 14 05/27/2017 0839   AST 12 05/31/2016 1400   ALT 13 05/27/2017 0839   ALT 14 05/31/2016 1400   ALKPHOS 81 05/27/2017 0839   ALKPHOS 85 05/31/2016 1400   BILITOT 0.4 05/27/2017 0839   BILITOT 0.52 05/31/2016 1400   GFRNONAA >60 05/27/2017 0839   GFRAA >60 05/27/2017 0839    INo results found for: SPEP, UPEP  Lab Results  Component Value Date   WBC 6.0 05/27/2017   NEUTROABS 3.3 05/27/2017   HGB 14.8 05/27/2017   HCT 42.9 05/27/2017   MCV 89.6 05/27/2017   PLT 260 05/27/2017      Chemistry      Component Value Date/Time   NA 137 05/27/2017 0839   NA 141 05/31/2016 1400   K 3.9 05/27/2017 0839   K 3.9 05/31/2016 1400   CL 107 05/27/2017 0839   CO2 20 (L) 05/27/2017 0839   CO2 26 05/31/2016 1400   BUN 16 05/27/2017 0839   BUN 18.9 05/31/2016 1400   CREATININE 0.79 05/27/2017 0839   CREATININE 0.8 05/31/2016 1400      Component Value Date/Time   CALCIUM 10.0 05/27/2017 0839   CALCIUM 10.0 05/31/2016 1400   ALKPHOS 81 05/27/2017 0839   ALKPHOS 85 05/31/2016 1400   AST 14 05/27/2017 0839   AST 12 05/31/2016 1400   ALT 13 05/27/2017 0839   ALT 14 05/31/2016 1400   BILITOT 0.4 05/27/2017 0839   BILITOT 0.52 05/31/2016 1400  No results found for: LABCA2  No components found for:  EYCXK481  No results for input(s): INR in the last 168 hours.  Urinalysis    Component Value Date/Time   COLORURINE RED (A) 01/13/2014 0925   APPEARANCEUR TURBID (A) 01/13/2014 0925   LABSPEC 1.037 (H) 01/13/2014 0925   PHURINE 5.5 01/13/2014 0925   GLUCOSEU NEGATIVE 01/13/2014 0925   HGBUR LARGE (A) 01/13/2014 0925   BILIRUBINUR SMALL (A) 01/13/2014 0925   KETONESUR NEGATIVE 01/13/2014 0925   PROTEINUR >300 (A) 01/13/2014 0925   UROBILINOGEN 1.0 01/13/2014 0925   NITRITE NEGATIVE 01/13/2014 0925   LEUKOCYTESUR SMALL (A) 01/13/2014 0925      ELIGIBLE FOR AVAILABLE RESEARCH PROTOCOL: no  STUDIES: Dg Ribs Unilateral W/chest Right  Result Date: 06/04/2017 CLINICAL DATA:  Right rib pain post fall 2 days ago.  Breast cancer EXAM: RIGHT RIBS AND CHEST - 3+ VIEW COMPARISON:  CT chest 06/23/2015 FINDINGS: Mild patchy airspace disease in the right cardiophrenic angle. This was not present previously. No significant pleural effusion. Heart size upper normal. Negative for heart failure. Left lung is clear. Surgical clips in the left breast from prior biopsy. Nondisplaced rib right sixth rib anterior laterally. IMPRESSION: Patchy right lower lobe airspace disease, possible pneumonia or contusion. Nondisplaced rib right sixth rib anterior laterally. Electronically Signed   By: Franchot Gallo M.D.   On: 06/04/2017 09:02   US Breast Ltd Uni Right Inc Axilla  Result Date: 06/14/2017 CLINICAL DATA:  LEFT lumpectomy in the 2017.The patient fell 2 weeks ago and broke ribs on the right. Patient has bruising in the LATERAL aspect of the RIGHT breast. Patient has 1 or 2 palpable areas in the 10 o'clock location of the RIGHT breast. EXAM: DIGITAL DIAGNOSTIC BILATERAL MAMMOGRAM WITH CAD AND TOMO ULTRASOUND RIGHT BREAST COMPARISON:  07/11/2015 and earlier ACR Breast Density Category b: There are scattered areas of fibroglandular density. FINDINGS: Post operative changes are seen in the LEFTbreast. No  suspicious mass, distortion, or microcalcifications are identified to suggest presence of malignancy. Mammographic images were processed with CAD. On physical exam, there is residual ecchymosis in the LOWER OUTER QUADRANT of the RIGHT breast. In the 10 o'clock location. I palpate focal thickening in the 10 o'clock location of the RIGHT breast 9 centimeters from the nipple, not associated overlying ecchymosis. Targeted ultrasound is performed, showing a broad area of hyperechoic breast parenchyma with interspersed cystic spaces in the 10 o'clock location of the RIGHT breast 9 centimeters from the nipple and corresponding to the palpable abnormality. The region measures approximately 2.5 x 2.4 x 0.9 centimeters. The findings are most consistent with benign fat necrosis. IMPRESSION: 1. Expected postoperative changes in the LEFT breast. 2. Probable fat necrosis in the 10 o'clock location of the RIGHT breast warranting of close follow-up. RECOMMENDATION: RIGHT breast ultrasound is recommended in 3 months to assess stability. I have discussed the findings and recommendations with the patient. Results were also provided in writing at the conclusion of the visit. If applicable, a reminder letter will be sent to the patient regarding the next appointment. BI-RADS CATEGORY  3: Probably benign. Electronically Signed   By: Nolon Nations M.D.   On: 06/14/2017 12:21   Mm Diag Breast Tomo Bilateral  Result Date: 06/14/2017 CLINICAL DATA:  LEFT lumpectomy in the 2017.The patient fell 2 weeks ago and broke ribs on the right. Patient has bruising in the LATERAL aspect of the RIGHT breast. Patient has 1 or 2 palpable areas in the 10 o'clock  location of the RIGHT breast. EXAM: DIGITAL DIAGNOSTIC BILATERAL MAMMOGRAM WITH CAD AND TOMO ULTRASOUND RIGHT BREAST COMPARISON:  07/11/2015 and earlier ACR Breast Density Category b: There are scattered areas of fibroglandular density. FINDINGS: Post operative changes are seen in the  LEFTbreast. No suspicious mass, distortion, or microcalcifications are identified to suggest presence of malignancy. Mammographic images were processed with CAD. On physical exam, there is residual ecchymosis in the LOWER OUTER QUADRANT of the RIGHT breast. In the 10 o'clock location. I palpate focal thickening in the 10 o'clock location of the RIGHT breast 9 centimeters from the nipple, not associated overlying ecchymosis. Targeted ultrasound is performed, showing a broad area of hyperechoic breast parenchyma with interspersed cystic spaces in the 10 o'clock location of the RIGHT breast 9 centimeters from the nipple and corresponding to the palpable abnormality. The region measures approximately 2.5 x 2.4 x 0.9 centimeters. The findings are most consistent with benign fat necrosis. IMPRESSION: 1. Expected postoperative changes in the LEFT breast. 2. Probable fat necrosis in the 10 o'clock location of the RIGHT breast warranting of close follow-up. RECOMMENDATION: RIGHT breast ultrasound is recommended in 3 months to assess stability. I have discussed the findings and recommendations with the patient. Results were also provided in writing at the conclusion of the visit. If applicable, a reminder letter will be sent to the patient regarding the next appointment. BI-RADS CATEGORY  3: Probably benign. Electronically Signed   By: Nolon Nations M.D.   On: 06/14/2017 12:21    ASSESSMENT: 72 y.o. [place] woman status post left breast lower outer quadrant Biopsy 05/20/2015 for a clinical T1c N0, stage IA invasive ductal carcinoma, grade 2, strongly estrogen and progesterone receptor positive, HER-2 nonamplified, with an MIB-1 of 25%  (1) left lumpectomy and sentinel lymph node sampling 07/11/2015 showed a pT1b pN0, stage IA invasive ductal carcinoma, grade 2, with repeat HER-2 again negative  (2) Oncotype DX score of 17 predicts a risk of recurrence outside the breast within 10 years of 11% if the patient's  only systemic therapy is tamoxifen for 5 years. It also predicts no benefit from chemotherapy.  (3) adjuvant radiation 08-29-15  To 09-19-15:   Left breast / 42.56 Gy in 16 fractions                       (4) anastrozole started 09/29/2015.  (a) bone density 05/17/2016 shows a T score of -1.4  (5) genetics testing 07/22/2015 through the [place] offered by [facility] found no deleterious mutations in ATM, BARD1, BRCA1, BRCA2, BRIP1, CDH1, CHEK2, EPCAM, FANCC, MLH1, MSH2, MSH6, NBN, PALB2, PMS2, PTEN, RAD51C, RAD51D, TP53, and XRCC2  (a) One variant of uncertain significance (VUS) called, "c.8351G>A (p.[id number])"  was found in BRCA2   PLAN: Tiffiany is now just about 2 years out from definitive surgery for her breast cancer with no evidence of disease recurrence.  This is favorable.  She is tolerating anastrozole well and the plan is to continue the drug for a total of 5 years.  She will be due for repeat bone density in 2020.  We discussed fat necrosis which is what seems to be going on in her right breast and of course this will be followed with a repeat right mammogram in August.  I am going to start seeing her on a once a year basis.  She knows to call for any issues that may develop before the next visit.   Annaka Cleaver, Virgie Dad, MD  06/18/17 10:27 AM Medical Oncology and Hematology [facility] [address] [place], [place] [address] Tel. [phone]    Fax. [phone]  This document serves as a record of services personally performed by Lurline Del, MD. It was created on his behalf by Sheron Nightingale, a trained medical scribe. The creation of this record is based on the scribe's personal observations and the provider's statements to them.   I have reviewed the above documentation for accuracy and completeness, and I agree with the above.

## 2017-06-14 ENCOUNTER — Ambulatory Visit
Admission: RE | Admit: 2017-06-14 | Discharge: 2017-06-14 | Disposition: A | Discharge status: Home / Self Care | Payer: Medicare Other | Source: Ambulatory Visit | Attending: Internal Medicine | Admitting: Internal Medicine

## 2017-06-14 ENCOUNTER — Other Ambulatory Visit: Payer: Self-pay | Admitting: Internal Medicine

## 2017-06-14 DIAGNOSIS — N6310 Unspecified lump in the right breast, unspecified quadrant: Secondary | ICD-10-CM

## 2017-06-14 DIAGNOSIS — C50912 Malignant neoplasm of unspecified site of left female breast: Secondary | ICD-10-CM

## 2017-06-14 HISTORY — DX: Personal history of irradiation: Z92.3

## 2017-06-18 ENCOUNTER — Inpatient Hospital Stay: Payer: Medicare Other | Attending: Oncology | Admitting: Oncology

## 2017-06-18 VITALS — BP 139/71 | HR 85 | Temp 97.6°F | Resp 18 | Ht 70.0 in | Wt 165.6 lb

## 2017-06-18 DIAGNOSIS — M129 Arthropathy, unspecified: Secondary | ICD-10-CM | POA: Diagnosis not present

## 2017-06-18 DIAGNOSIS — Z79811 Long term (current) use of aromatase inhibitors: Secondary | ICD-10-CM | POA: Diagnosis not present

## 2017-06-18 DIAGNOSIS — Z803 Family history of malignant neoplasm of breast: Secondary | ICD-10-CM | POA: Diagnosis not present

## 2017-06-18 DIAGNOSIS — Z90710 Acquired absence of both cervix and uterus: Secondary | ICD-10-CM | POA: Insufficient documentation

## 2017-06-18 DIAGNOSIS — Z923 Personal history of irradiation: Secondary | ICD-10-CM | POA: Diagnosis not present

## 2017-06-18 DIAGNOSIS — Z7982 Long term (current) use of aspirin: Secondary | ICD-10-CM | POA: Diagnosis not present

## 2017-06-18 DIAGNOSIS — Z800 Family history of malignant neoplasm of digestive organs: Secondary | ICD-10-CM | POA: Diagnosis not present

## 2017-06-18 DIAGNOSIS — Z170 Estrogen receptor positive status [ER+]: Secondary | ICD-10-CM | POA: Diagnosis not present

## 2017-06-18 DIAGNOSIS — Z9049 Acquired absence of other specified parts of digestive tract: Secondary | ICD-10-CM | POA: Diagnosis not present

## 2017-06-18 DIAGNOSIS — Z85828 Personal history of other malignant neoplasm of skin: Secondary | ICD-10-CM | POA: Insufficient documentation

## 2017-06-18 DIAGNOSIS — Z79899 Other long term (current) drug therapy: Secondary | ICD-10-CM | POA: Insufficient documentation

## 2017-06-18 DIAGNOSIS — R232 Flushing: Secondary | ICD-10-CM | POA: Insufficient documentation

## 2017-06-18 DIAGNOSIS — E785 Hyperlipidemia, unspecified: Secondary | ICD-10-CM | POA: Insufficient documentation

## 2017-06-18 DIAGNOSIS — R918 Other nonspecific abnormal finding of lung field: Secondary | ICD-10-CM | POA: Diagnosis not present

## 2017-06-18 DIAGNOSIS — Z87891 Personal history of nicotine dependence: Secondary | ICD-10-CM | POA: Diagnosis not present

## 2017-06-18 DIAGNOSIS — F329 Major depressive disorder, single episode, unspecified: Secondary | ICD-10-CM | POA: Diagnosis not present

## 2017-06-18 DIAGNOSIS — C50512 Malignant neoplasm of lower-outer quadrant of left female breast: Secondary | ICD-10-CM | POA: Diagnosis present

## 2017-06-18 MED ORDER — ANASTROZOLE 1 MG PO TABS: 1.0000 mg | ORAL_TABLET | Freq: Every day | ORAL | 4 refills | Status: DC

## 2017-06-19 ENCOUNTER — Other Ambulatory Visit: Payer: Self-pay | Admitting: *Deleted

## 2017-06-21 ENCOUNTER — Telehealth: Payer: Self-pay | Admitting: Oncology

## 2017-06-21 NOTE — Telephone Encounter (Signed)
Spoke with patient re lab/fu June 2020. [facility] will contact patient re dexa scan.

## 2017-09-17 ENCOUNTER — Other Ambulatory Visit: Payer: Medicare Other

## 2017-10-01 ENCOUNTER — Ambulatory Visit
Admission: RE | Admit: 2017-10-01 | Discharge: 2017-10-01 | Disposition: A | Discharge status: Home / Self Care | Payer: Medicare Other | Source: Ambulatory Visit | Attending: Internal Medicine | Admitting: Internal Medicine

## 2017-10-01 DIAGNOSIS — N6310 Unspecified lump in the right breast, unspecified quadrant: Secondary | ICD-10-CM

## 2017-10-02 ENCOUNTER — Other Ambulatory Visit: Payer: Medicare Other

## 2018-06-25 ENCOUNTER — Other Ambulatory Visit: Payer: Self-pay | Admitting: Oncology

## 2018-07-02 ENCOUNTER — Telehealth: Payer: Self-pay | Admitting: Oncology

## 2018-07-02 NOTE — Telephone Encounter (Signed)
Called patient regarding upcoming [facility] appointment, per patient's request this needs to be a telephone visit. °

## 2018-07-04 ENCOUNTER — Telehealth: Payer: Self-pay | Admitting: Oncology

## 2018-07-06 NOTE — Progress Notes (Signed)
[place]  Telephone:([phone]) [phone] Fax:(336) [phone]     ID: Stacey Barrett DOB: 10/25/1945  MR#: [id number]  YTK#:[id number]  Patient Care Team: Willey Blade, MD as PCP - General (Internal Medicine) , Virgie Dad, MD as Consulting Physician (Oncology) Fanny Skates, MD as Consulting Physician (General Surgery) Eppie Gibson, MD as Attending Physician (Radiation Oncology) Dasher, Rayvon Char, MD (Dermatology) Juanita Craver, MD as Consulting Physician (Gastroenterology) OTHER MD:  I connected with [name] on 07/07/18 at  1:30 PM EDT by telephone visit and verified that I am speaking with the correct person using two identifiers.   I discussed the limitations, risks, security and privacy concerns of performing an evaluation and management service by telemedicine and the availability of in-person appointments. I also discussed with the patient that there may be a patient responsible charge related to this service. The patient expressed understanding and agreed to proceed.   Other persons participating in the visit and their role in the encounter: Wilburn Mylar, scribe   Patient's location: home  Provider's location: [place]    CHIEF COMPLAINT: estrogen receptor positive breast cancer  CURRENT TREATMENT: Anastrozole   INTERVAL HISTORY: Stacey Barrett was contacted today for follow-up of her estrogen receptor positive breast cancer.   She continues on anastrozole, with good tolerance. She reports insomnia-- she dozes off, wakes up, and "I'm up for a couple of hours." She reports sometimes the hot flashes wake her up, but she notes even then, she's just awake.   Since her last visit, she underwent follow up right breast ultrasound on 10/01/2017 regarding suspected fat necrosis. No discrete mass was palpated on physical exam, and ultrasound confirmed interval resolution of fat necrosis. Routine diagnostic mammogram was due in 06/2018.   Bone density testing order was placed at her last visit but never scheduled.   REVIEW OF SYSTEMS: Stacey Barrett reports doing well overall. 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 The patient denies unusual headaches, visual changes, nausea, vomiting, stiff neck, dizziness, or gait imbalance. There has been no cough, phlegm production, or pleurisy, no chest pain or pressure, and no change in bowel or bladder habits. The patient denies fever, rash, bleeding, unexplained fatigue or unexplained weight loss. A detailed review of systems was otherwise entirely negative.   BREAST CANCER HISTORY: From the original intake note:  Stacey Barrett had routine lateral mammographic screening with tomography at [facility] 05/12/2015 showing an area of possible asymmetry in the right breast and a possible mass in the left breast. Diagnostic right mammography with tomography and left breast ultrasonography was performed 05/20/2015. Breast density was category B. The area of concern in the posterior right breast proved to be a 7 mm normal-appearing intramammary lymph node.  In the left breast however compression views showed a far posterior mass with irregular margins measuring 13 mm. This was palpable deep in the lower outer quadrant of the left breast. Ultrasonography of the left breastconfirmed a 1.6 cm hypoechoic mass with irregular margins at the 5:00 position 3 cm from the nipple. Ultrasound of the axilla showed no suspicious findings.  Biopsy of the left breast mass in question 05/30/2015 showed ([id number]) and invasive ductal carcinoma, grade 2, estrogen receptor 100% positive, addition receptor 95% positive, both with strong staining intensity, with an MIB-1 of 25%, and no HER-2 amplification, the signals ratio being 1.27 and the number per cell 5.55.  Her subsequent  treatment is as detailed below   PAST MEDICAL HISTORY: Past Medical  History:  Diagnosis Date  . Arthritis   . Breast cancer ([place])   . Breast cancer of lower-outer quadrant of left female breast ([place]) 06/01/2015  . Depression   . Hyperlipidemia   . Personal history of radiation therapy   . Skin cancer    05/2013    PAST SURGICAL HISTORY: Past Surgical History:  Procedure Laterality Date  . ABDOMINAL HYSTERECTOMY    . BREAST BIOPSY    . BREAST LUMPECTOMY Left    2017  . CHOLECYSTECTOMY    . FACIAL COSMETIC SURGERY    . fatty tumor Left    under left arm  . RADIOACTIVE SEED GUIDED PARTIAL MASTECTOMY WITH AXILLARY SENTINEL LYMPH NODE BIOPSY Left 07/11/2015   Procedure: INJECT BLUE DYE LEFT BREAST, RADIOACTIVE SEED GUIDED LEFT BREAST LUMPECTOMY WITH LEFT AXILLARY SENTINEL LYMPH NODE BIOPSY ;  Surgeon: Fanny Skates, MD;  Location: [facility] OR;  Service: General;  Laterality: Left;    FAMILY HISTORY Family History  Problem Relation Age of Onset  . Breast cancer Mother        dx. between 83-75; s/p lumpectomy  . Colon cancer Maternal Uncle 29  . Skin cancer Maternal Uncle        NOS type  . Anemia Father        later in life  . Lupus Sister   . Other Sister        hysterectomy at a "young age" for unspecified reason  . Skin cancer Maternal Grandfather        NOS type  . Skin cancer Son        NOS type - on back  . Breast cancer Sister 25       Stacey Barrett have genetic testing soon; lives in [place]  . Multiple myeloma Sister        dx. 75-67  . Skin cancer Sister        NOS type  . Breast cancer Other 28       maternal great aunt (MGM's sister)   the patient's father died at the age of 30. The patient's mother died at the age of 54. Stacey Barrett had no brothers, 3 sisters. Her mother was diagnosed with breast cancer at age 24. A maternal uncle was diagnosed with colon cancer. In addition one of Stacey Barrett's sisters was diagnosed with breast cancer at age 33. A second sister has a history of multiple myeloma.   GYNECOLOGIC HISTORY:  No LMP recorded. Patient has  had a hysterectomy. Menarche age 7, first live birth age 69, she is [place] P3. She underwent total abdominal hysterectomy with bilateral salpingo-oophorectomy age 61. She took hormone replacement about 3 months and then developed a pulmonary embolus. However she has a history of approximately 8 years on oral contraceptives remotely with no clots or other complications.   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 She is widowed and lives alone with a standard [name] [place] terrier. Her sons [name] and [name] living [place] and also worked in the [employer]. Son [name] lives in [place] and he is a Scientist, research (physical sciences). The patient has 3 grandchildren. She is not a church attender    ADVANCED DIRECTIVES: in place. The patient's son [name] is her healthcare power of attorney. He can be reached at [phone].   HEALTH MAINTENANCE: Social History   Tobacco Use  . Smoking status: Former Smoker  Packs/day: 1.00    Years: 14.00    Pack years: 14.00    Types: Cigarettes    Last attempt to quit: 02/05/1973    Years since quitting: 45.4  . Smokeless tobacco: Never Used  Substance Use Topics  . Alcohol use: Yes    Comment: ocassional  . Drug use: No     Colonoscopy:  PAP:  Bone density:  Lipid panel:  No Known Allergies  Current Outpatient Medications  Medication Sig Dispense Refill  . anastrozole (ARIMIDEX) 1 MG tablet Take 1 tablet (1 mg total) by mouth daily. 90 tablet 4  . aspirin EC 81 MG tablet Take 81 mg by mouth daily.    . cholecalciferol (VITAMIN D) 1000 units tablet Take 1 tablet (1,000 Units total) by mouth daily. 100 tablet 4  . gabapentin (NEURONTIN) 300 MG capsule Take 1 capsule (300 mg total) by mouth at bedtime. 90 capsule 4  . ibuprofen (ADVIL,MOTRIN) 200 MG tablet Take 400-600 mg by mouth at bedtime as needed for moderate pain.     No current facility-administered medications for this visit.     OBJECTIVE: middle-aged white woman in  no acute distress  There were no vitals filed for this visit.   There is no height or weight on file to calculate BMI.    ECOG FS:1 - Symptomatic but completely ambulatory    LAB RESULTS:  CMP     Component Value Date/Time   NA 137 05/27/2017 0839   NA 141 05/31/2016 1400   K 3.9 05/27/2017 0839   K 3.9 05/31/2016 1400   CL 107 05/27/2017 0839   CO2 20 (L) 05/27/2017 0839   CO2 26 05/31/2016 1400   GLUCOSE 94 05/27/2017 0839   GLUCOSE 107 05/31/2016 1400   BUN 16 05/27/2017 0839   BUN 18.9 05/31/2016 1400   CREATININE 0.79 05/27/2017 0839   CREATININE 0.8 05/31/2016 1400   CALCIUM 10.0 05/27/2017 0839   CALCIUM 10.0 05/31/2016 1400   PROT 7.3 05/27/2017 0839   PROT 7.2 05/31/2016 1400   ALBUMIN 4.2 05/27/2017 0839   ALBUMIN 4.2 05/31/2016 1400   AST 14 05/27/2017 0839   AST 12 05/31/2016 1400   ALT 13 05/27/2017 0839   ALT 14 05/31/2016 1400   ALKPHOS 81 05/27/2017 0839   ALKPHOS 85 05/31/2016 1400   BILITOT 0.4 05/27/2017 0839   BILITOT 0.52 05/31/2016 1400   GFRNONAA >60 05/27/2017 0839   GFRAA >60 05/27/2017 0839    INo results found for: SPEP, UPEP  Lab Results  Component Value Date   WBC 6.0 05/27/2017   NEUTROABS 3.3 05/27/2017   HGB 14.8 05/27/2017   HCT 42.9 05/27/2017   MCV 89.6 05/27/2017   PLT 260 05/27/2017      Chemistry      Component Value Date/Time   NA 137 05/27/2017 0839   NA 141 05/31/2016 1400   K 3.9 05/27/2017 0839   K 3.9 05/31/2016 1400   CL 107 05/27/2017 0839   CO2 20 (L) 05/27/2017 0839   CO2 26 05/31/2016 1400   BUN 16 05/27/2017 0839   BUN 18.9 05/31/2016 1400   CREATININE 0.79 05/27/2017 0839   CREATININE 0.8 05/31/2016 1400      Component Value Date/Time   CALCIUM 10.0 05/27/2017 0839   CALCIUM 10.0 05/31/2016 1400   ALKPHOS 81 05/27/2017 0839   ALKPHOS 85 05/31/2016 1400   AST 14 05/27/2017 0839   AST 12 05/31/2016 1400   ALT 13 05/27/2017 0839  ALT 14 05/31/2016 1400   BILITOT 0.4 05/27/2017 0839    BILITOT 0.52 05/31/2016 1400       No results found for: LABCA2  No components found for: OVFIE332  No results for input(s): INR in the last 168 hours.  Urinalysis    Component Value Date/Time   COLORURINE RED (A) 01/13/2014 0925   APPEARANCEUR TURBID (A) 01/13/2014 0925   LABSPEC 1.037 (H) 01/13/2014 0925   PHURINE 5.5 01/13/2014 0925   GLUCOSEU NEGATIVE 01/13/2014 0925   HGBUR LARGE (A) 01/13/2014 0925   BILIRUBINUR SMALL (A) 01/13/2014 0925   KETONESUR NEGATIVE 01/13/2014 0925   PROTEINUR >300 (A) 01/13/2014 0925   UROBILINOGEN 1.0 01/13/2014 0925   NITRITE NEGATIVE 01/13/2014 0925   LEUKOCYTESUR SMALL (A) 01/13/2014 0925      ELIGIBLE FOR AVAILABLE RESEARCH PROTOCOL: no  STUDIES: No results found.  ASSESSMENT: 73 y.o. [employer] woman status post left breast lower outer quadrant Biopsy 05/20/2015 for a clinical T1c N0, stage IA invasive ductal carcinoma, grade 2, strongly estrogen and progesterone receptor positive, HER-2 nonamplified, with an MIB-1 of 25%  (1) left lumpectomy and sentinel lymph node sampling 07/11/2015 showed a pT1b pN0, stage IA invasive ductal carcinoma, grade 2, with repeat HER-2 again negative  (2) Oncotype DX score of 17 predicts a risk of recurrence outside the breast within 10 years of 11% if the patient's only systemic therapy is tamoxifen for 5 years. It also predicts no benefit from chemotherapy.  (3) adjuvant radiation 08-29-15  To 09-19-15:   Left breast / 42.56 Gy in 16 fractions                       (4) anastrozole started 09/29/2015.  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  This is very favorable.  She is tolerating anastrozole well and the plan is to continue Adderall 5 years.  I am not sure why her insomnia should be worse now than before.  This usually is multifactorial.  I have suggested that she keep her bedroom very cold at night.  In addition we are going to give gabapentin at bedtime to try.  I have written a prescription for her.  She Stacey Barrett let me know if she has any problems with it.  She is due for repeat bone density this year and I put that order in for her  Otherwise she Stacey Barrett see me again in 1 year.  She knows to call for any other issue that may develop before then.  , Virgie Dad, MD  07/07/18 1:53 PM Medical Oncology and Hematology [facility] [address] [place], [place] [address] Tel. [phone]    Fax. [phone]   I, Wilburn Mylar, am acting as scribe for Dr. Virgie Dad. .  I, Lurline Del MD, have reviewed the above documentation for accuracy and completeness, and I agree with the above.

## 2018-07-07 ENCOUNTER — Inpatient Hospital Stay: Payer: Medicare Other | Attending: Oncology | Admitting: Oncology

## 2018-07-07 ENCOUNTER — Other Ambulatory Visit: Payer: Medicare Other

## 2018-07-07 DIAGNOSIS — C50512 Malignant neoplasm of lower-outer quadrant of left female breast: Secondary | ICD-10-CM | POA: Diagnosis not present

## 2018-07-07 DIAGNOSIS — Z170 Estrogen receptor positive status [ER+]: Secondary | ICD-10-CM

## 2018-07-07 DIAGNOSIS — Z7982 Long term (current) use of aspirin: Secondary | ICD-10-CM

## 2018-07-07 DIAGNOSIS — Z923 Personal history of irradiation: Secondary | ICD-10-CM | POA: Diagnosis not present

## 2018-07-07 DIAGNOSIS — Z87891 Personal history of nicotine dependence: Secondary | ICD-10-CM

## 2018-07-07 DIAGNOSIS — Z79811 Long term (current) use of aromatase inhibitors: Secondary | ICD-10-CM

## 2018-07-07 DIAGNOSIS — Z79899 Other long term (current) drug therapy: Secondary | ICD-10-CM

## 2018-07-07 MED ORDER — ANASTROZOLE 1 MG PO TABS: 1.0000 mg | ORAL_TABLET | Freq: Every day | ORAL | 4 refills | Status: DC

## 2018-07-07 MED ORDER — GABAPENTIN 300 MG PO CAPS: 300.0000 mg | ORAL_CAPSULE | Freq: Every day | ORAL | 4 refills | Status: DC

## 2018-09-25 ENCOUNTER — Ambulatory Visit
Admission: RE | Admit: 2018-09-25 | Discharge: 2018-09-25 | Disposition: A | Discharge status: Home / Self Care | Payer: Medicare Other | Source: Ambulatory Visit | Attending: Oncology | Admitting: Oncology

## 2018-09-25 ENCOUNTER — Other Ambulatory Visit: Payer: Self-pay

## 2018-09-25 DIAGNOSIS — C50512 Malignant neoplasm of lower-outer quadrant of left female breast: Secondary | ICD-10-CM

## 2019-03-15 ENCOUNTER — Ambulatory Visit: Payer: Medicare Other | Attending: Internal Medicine

## 2019-03-15 DIAGNOSIS — Z23 Encounter for immunization: Secondary | ICD-10-CM | POA: Insufficient documentation

## 2019-03-15 NOTE — Progress Notes (Signed)
   Covid-19 Vaccination Clinic  Name:  Stacey Barrett    MRN: [id number] DOB: 1945-04-21  03/15/2019  Stacey Barrett was observed post Covid-19 immunization for 15 minutes without incidence. She was provided with Vaccine Information Sheet and instruction to access the V-Safe system.   Stacey Barrett was instructed to call 911 with any severe reactions post vaccine: Marland Kitchen Difficulty breathing  . Swelling of your face and throat  . A fast heartbeat  . A bad rash all over your body  . Dizziness and weakness    Immunizations Administered    Name Date Dose VIS Date Route   Pfizer COVID-19 Vaccine 03/15/2019  3:31 PM 0.3 mL 01/16/2019 Intramuscular   Manufacturer: [place]   Lot: [id number]   [place]: [id number]

## 2019-03-31 ENCOUNTER — Ambulatory Visit: Payer: Medicare Other

## 2019-04-09 ENCOUNTER — Ambulatory Visit: Payer: Medicare Other | Attending: Internal Medicine

## 2019-04-09 DIAGNOSIS — Z23 Encounter for immunization: Secondary | ICD-10-CM | POA: Insufficient documentation

## 2019-04-09 NOTE — Progress Notes (Signed)
   Covid-19 Vaccination Clinic  Name:  Stacey Barrett    MRN: [id number] DOB: 05/01/1945  04/09/2019  [name] was observed post Covid-19 immunization for 15 minutes without incident. She was provided with Vaccine Information Sheet and instruction to access the V-Safe system.   [name] was instructed to call 911 with any severe reactions post vaccine: Marland Kitchen Difficulty breathing  . Swelling of face and throat  . A fast heartbeat  . A bad rash all over body  . Dizziness and weakness   Immunizations Administered    Name Date Dose VIS Date Route   Pfizer COVID-19 Vaccine 04/09/2019  8:37 AM 0.3 mL 01/16/2019 Intramuscular   Manufacturer: [place]   Lot: [id number]   [place]: [id number]

## 2019-07-06 NOTE — Progress Notes (Signed)
[facility]  [phone] [phone]     ID: Stacey Barrett DOB: May 12, 1945  MR#: [id number]  KNL#:[id number]  Patient Care Team: Willey Blade, MD as PCP - General (Internal Medicine) Debroh Sieloff, Virgie Dad, MD as Consulting Physician (Oncology) Fanny Skates, MD as Consulting Physician (General Surgery) Eppie Gibson, MD as Attending Physician (Radiation Oncology) Dasher, Rayvon Char, MD (Dermatology) Juanita Craver, MD as Consulting Physician (Gastroenterology) OTHER MD:   CHIEF COMPLAINT: estrogen receptor positive breast cancer  CURRENT TREATMENT: Anastrozole   INTERVAL HISTORY: Stacey Barrett returns today for follow-up of her estrogen receptor positive breast cancer.   She continues on anastrozole.  She tolerates this with no unusual side effects.  Hot flashes are occasional.  They do not wake her up.  Since her last visit, she underwent bilateral diagnostic mammography with tomography at [facility] on 09/25/2018 showing: breast density category B; no evidence of malignancy in either breast.   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  REVIEW OF SYSTEMS: Stacey Barrett videos with a trainer once a week.  Other days she does weights.  Her job of course involves a lot of lifting and carrying and packing so she is very physically active.  She and her family have all been receive the COVID-19 vaccines.   BREAST CANCER HISTORY: From the original intake note:  Stacey Barrett had routine lateral mammographic screening with tomography at [facility] 05/12/2015 showing an area of possible asymmetry in the right breast and a possible mass in the left breast. Diagnostic right mammography with tomography and left breast ultrasonography was performed 05/20/2015. Breast density was category B. The area of concern in the posterior right breast proved to be a 7 mm normal-appearing intramammary lymph  node.  In the left breast however compression views showed a far posterior mass with irregular margins measuring 13 mm. This was palpable deep in the lower outer quadrant of the left breast. Ultrasonography of the left breastconfirmed a 1.6 cm hypoechoic mass with irregular margins at the 5:00 position 3 cm from the nipple. Ultrasound of the axilla showed no suspicious findings.  Biopsy of the left breast mass in question 05/30/2015 showed ([id number]) and invasive ductal carcinoma, grade 2, estrogen receptor 100% positive, addition receptor 95% positive, both with strong staining intensity, with an MIB-1 of 25%, and no HER-2 amplification, the signals ratio being 1.27 and the number per cell 5.55.  Her subsequent treatment is as detailed below   PAST MEDICAL HISTORY: Past Medical History:  Diagnosis Date  . Arthritis   . Breast cancer ([facility])   . Breast cancer of lower-outer quadrant of left female breast ([facility]) 06/01/2015  . Depression   . Hyperlipidemia   . Personal history of radiation therapy   . Skin cancer    05/2013    PAST SURGICAL HISTORY: Past Surgical History:  Procedure Laterality Date  . ABDOMINAL HYSTERECTOMY    . BREAST BIOPSY    . BREAST LUMPECTOMY Left    2017  . CHOLECYSTECTOMY    . FACIAL COSMETIC SURGERY    . fatty tumor Left    under left arm  . RADIOACTIVE SEED GUIDED PARTIAL MASTECTOMY WITH AXILLARY SENTINEL LYMPH NODE BIOPSY Left 07/11/2015   Procedure: INJECT BLUE DYE LEFT BREAST, RADIOACTIVE SEED GUIDED LEFT BREAST LUMPECTOMY WITH LEFT AXILLARY SENTINEL LYMPH NODE BIOPSY ;  Surgeon: Fanny Skates, MD;  Location: [facility];  Service: General;  Laterality: Left;  FAMILY HISTORY Family History  Problem Relation Age of Onset  . Breast cancer Mother        dx. between 88-75; s/p lumpectomy  . Colon cancer Maternal Uncle 82  . Skin cancer Maternal Uncle        NOS type  . Anemia Father        later in life  . Lupus Sister   . Other Sister         hysterectomy at a "young age" for unspecified reason  . Skin cancer Maternal Grandfather        NOS type  . Skin cancer Son        NOS type - on back  . Breast cancer Sister 69       will have genetic testing soon; lives in [place]  . Multiple myeloma Sister        dx. 40-67  . Skin cancer Sister        NOS type  . Breast cancer Other 65       maternal great aunt (MGM's sister)   the patient's father died at the age of 70. The patient's mother died at the age of 82. Stacey Barrett had no brothers, 3 sisters. Her mother was diagnosed with breast cancer at age 55. A maternal uncle was diagnosed with colon cancer. In addition one of Edyth's sisters was diagnosed with breast cancer at age 59. A second sister has a history of multiple myeloma.   GYNECOLOGIC HISTORY:  No LMP recorded. Patient has had a hysterectomy. Menarche age 22, first live birth age 76, she is Stacey Barrett P3. She underwent total abdominal hysterectomy with bilateral salpingo-oophorectomy age 57. She took hormone replacement about 3 months and then developed a pulmonary embolus. However she has a history of approximately 8 years on oral contraceptives remotely with no clots or other complications.   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 She is widowed and lives alone with a standard [name] [place] terrier. Her sons [name] and [name] living [place] and also worked in the [employer]. Son [name] lives in [place] and he is a Scientist, research (physical sciences). The patient has 3 grandchildren. She is not a church attender    ADVANCED DIRECTIVES: in place. The patient's son [name] is her healthcare power of attorney. He can be reached at [phone].   HEALTH MAINTENANCE: Social History   Tobacco Use  . Smoking status: Former Smoker    Packs/day: 1.00    Years: 14.00    Pack years: 14.00    Types: Cigarettes    Quit date: 02/05/1973    Years since quitting: 46.4  . Smokeless tobacco: Never Used  Substance Use Topics    . Alcohol use: Yes    Comment: ocassional  . Drug use: No     Colonoscopy:  PAP:  Bone density:  Lipid panel:  No Known Allergies  Current Outpatient Medications  Medication Sig Dispense Refill  . anastrozole (ARIMIDEX) 1 MG tablet Take 1 tablet (1 mg total) by mouth daily. 90 tablet 4  . aspirin EC 81 MG tablet Take 81 mg by mouth daily.    . cholecalciferol (VITAMIN D) 1000 units tablet Take 1 tablet (1,000 Units total) by mouth daily. 100 tablet 4  . gabapentin (NEURONTIN) 300 MG capsule Take 1 capsule (300 mg total) by mouth at bedtime. 90 capsule 4  . ibuprofen (ADVIL,MOTRIN) 200 MG tablet Take 400-600 mg by mouth at bedtime as needed for  moderate pain.     No current facility-administered medications for this visit.    OBJECTIVE: White woman who appears younger than stated age  35:   07/07/19 0947  BP: 133/66  Pulse: 74  Resp: 20  Temp: 98.9 F (37.2 C)  SpO2: 100%     Body mass index is 22.87 kg/m.    ECOG FS:1 - Symptomatic but completely ambulatory  Sclerae unicteric, EOMs intact Wearing a mask No cervical or supraclavicular adenopathy Lungs no rales or rhonchi Heart regular rate and rhythm Abd soft, nontender, positive bowel sounds MSK no focal spinal tenderness, no upper extremity lymphedema Neuro: nonfocal, well oriented, appropriate affect Breasts: The right breast is unremarkable.  The left breast is status post lumpectomy.  There is no evidence of local recurrence.  Both axillae are benign.   LAB RESULTS:  CMP     Component Value Date/Time   NA 137 05/27/2017 0839   NA 141 05/31/2016 1400   K 3.9 05/27/2017 0839   K 3.9 05/31/2016 1400   CL 107 05/27/2017 0839   CO2 20 (L) 05/27/2017 0839   CO2 26 05/31/2016 1400   GLUCOSE 94 05/27/2017 0839   GLUCOSE 107 05/31/2016 1400   BUN 16 05/27/2017 0839   BUN 18.9 05/31/2016 1400   CREATININE 0.79 05/27/2017 0839   CREATININE 0.8 05/31/2016 1400   CALCIUM 10.0 05/27/2017 0839   CALCIUM  10.0 05/31/2016 1400   PROT 7.3 05/27/2017 0839   PROT 7.2 05/31/2016 1400   ALBUMIN 4.2 05/27/2017 0839   ALBUMIN 4.2 05/31/2016 1400   AST 14 05/27/2017 0839   AST 12 05/31/2016 1400   ALT 13 05/27/2017 0839   ALT 14 05/31/2016 1400   ALKPHOS 81 05/27/2017 0839   ALKPHOS 85 05/31/2016 1400   BILITOT 0.4 05/27/2017 0839   BILITOT 0.52 05/31/2016 1400   GFRNONAA >60 05/27/2017 0839   GFRAA >60 05/27/2017 0839    INo results found for: SPEP, UPEP  Lab Results  Component Value Date   WBC 6.0 07/07/2019   NEUTROABS 3.1 07/07/2019   HGB 14.6 07/07/2019   HCT 44.5 07/07/2019   MCV 92.5 07/07/2019   PLT 279 07/07/2019      Chemistry      Component Value Date/Time   NA 137 05/27/2017 0839   NA 141 05/31/2016 1400   K 3.9 05/27/2017 0839   K 3.9 05/31/2016 1400   CL 107 05/27/2017 0839   CO2 20 (L) 05/27/2017 0839   CO2 26 05/31/2016 1400   BUN 16 05/27/2017 0839   BUN 18.9 05/31/2016 1400   CREATININE 0.79 05/27/2017 0839   CREATININE 0.8 05/31/2016 1400      Component Value Date/Time   CALCIUM 10.0 05/27/2017 0839   CALCIUM 10.0 05/31/2016 1400   ALKPHOS 81 05/27/2017 0839   ALKPHOS 85 05/31/2016 1400   AST 14 05/27/2017 0839   AST 12 05/31/2016 1400   ALT 13 05/27/2017 0839   ALT 14 05/31/2016 1400   BILITOT 0.4 05/27/2017 0839   BILITOT 0.52 05/31/2016 1400       No results found for: LABCA2  No components found for: LABCA125  No results for input(s): INR in the last 168 hours.  Urinalysis    Component Value Date/Time   COLORURINE RED (A) 01/13/2014 0925   APPEARANCEUR TURBID (A) 01/13/2014 0925   LABSPEC 1.037 (H) 01/13/2014 0925   PHURINE 5.5 01/13/2014 0925   GLUCOSEU NEGATIVE 01/13/2014 0925   HGBUR LARGE (A) 01/13/2014 0174  BILIRUBINUR SMALL (A) 01/13/2014 0925   KETONESUR NEGATIVE 01/13/2014 0925   PROTEINUR >300 (A) 01/13/2014 0925   UROBILINOGEN 1.0 01/13/2014 0925   NITRITE NEGATIVE 01/13/2014 0925   LEUKOCYTESUR SMALL (A)  01/13/2014 0925    ELIGIBLE FOR AVAILABLE RESEARCH PROTOCOL: no  STUDIES: No results found.    ASSESSMENT: 74 y.o. Stacey Barrett woman status post left breast lower outer quadrant Biopsy 05/20/2015 for a clinical T1c N0, stage IA invasive ductal carcinoma, grade 2, strongly estrogen and progesterone receptor positive, HER-2 nonamplified, with an MIB-1 of 25%  (1) left lumpectomy and sentinel lymph node sampling 07/11/2015 showed a pT1b pN0, stage IA invasive ductal carcinoma, grade 2, with repeat HER-2 again negative  (2) Oncotype DX score of 17 predicts a risk of recurrence outside the breast within 10 years of 11% if the patient's only systemic therapy is tamoxifen for 5 years. It also predicts no benefit from chemotherapy.  (3) adjuvant radiation 08-29-15  To 09-19-15:   Left breast / 42.56 Gy in 16 fractions                       (4) anastrozole started 09/29/2015.  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  This is favorable.  She continues on anastrozole.  There has been a mild decrease in her T score on bone density scanning, but now down to -2.0 which is the point at which I suggest a pharmacologic intervention.  She continues to take vitamin D regularly and she is very active physically.  I entered her mammogram orders. I will see her 1 more time a year from now.  At that time she will be ready to  "graduate".  Total encounter time 20 minutes.*   Magrinat, Virgie Dad, MD  07/07/19 10:05 AM Medical Oncology and Hematology [facility] [place], [place] [address] Tel. [phone]    Fax. [phone]   I, Wilburn Mylar, am acting as scribe for Dr. Virgie Dad. Magrinat.  I, Lurline Del MD, have reviewed the above documentation for accuracy and completeness, and I agree with the above.   *Total Encounter Time as defined by the Centers for Medicare and Medicaid Services includes, in addition to the face-to-face time of a patient visit (documented in the note above) non-face-to-face time: obtaining and reviewing outside history, ordering and reviewing medications, tests or procedures, care coordination (communications with other health care professionals or caregivers) and documentation in the medical record.

## 2019-07-07 ENCOUNTER — Other Ambulatory Visit: Payer: Self-pay

## 2019-07-07 ENCOUNTER — Inpatient Hospital Stay: Payer: Medicare Other

## 2019-07-07 ENCOUNTER — Other Ambulatory Visit: Payer: Self-pay | Admitting: *Deleted

## 2019-07-07 ENCOUNTER — Inpatient Hospital Stay: Payer: Medicare Other | Attending: Oncology | Admitting: Oncology

## 2019-07-07 VITALS — BP 133/66 | HR 74 | Temp 98.9°F | Resp 20 | Ht 70.0 in | Wt 159.4 lb

## 2019-07-07 DIAGNOSIS — Z808 Family history of malignant neoplasm of other organs or systems: Secondary | ICD-10-CM | POA: Insufficient documentation

## 2019-07-07 DIAGNOSIS — Z85828 Personal history of other malignant neoplasm of skin: Secondary | ICD-10-CM | POA: Diagnosis not present

## 2019-07-07 DIAGNOSIS — Z86711 Personal history of pulmonary embolism: Secondary | ICD-10-CM | POA: Insufficient documentation

## 2019-07-07 DIAGNOSIS — Z87891 Personal history of nicotine dependence: Secondary | ICD-10-CM | POA: Diagnosis not present

## 2019-07-07 DIAGNOSIS — F329 Major depressive disorder, single episode, unspecified: Secondary | ICD-10-CM | POA: Diagnosis not present

## 2019-07-07 DIAGNOSIS — C50512 Malignant neoplasm of lower-outer quadrant of left female breast: Secondary | ICD-10-CM | POA: Diagnosis present

## 2019-07-07 DIAGNOSIS — Z170 Estrogen receptor positive status [ER+]: Secondary | ICD-10-CM | POA: Diagnosis not present

## 2019-07-07 DIAGNOSIS — M8580 Other specified disorders of bone density and structure, unspecified site: Secondary | ICD-10-CM | POA: Diagnosis not present

## 2019-07-07 DIAGNOSIS — Z79811 Long term (current) use of aromatase inhibitors: Secondary | ICD-10-CM | POA: Insufficient documentation

## 2019-07-07 DIAGNOSIS — R232 Flushing: Secondary | ICD-10-CM | POA: Diagnosis not present

## 2019-07-07 DIAGNOSIS — Z803 Family history of malignant neoplasm of breast: Secondary | ICD-10-CM | POA: Diagnosis not present

## 2019-07-07 DIAGNOSIS — Z923 Personal history of irradiation: Secondary | ICD-10-CM | POA: Insufficient documentation

## 2019-07-07 DIAGNOSIS — Z90722 Acquired absence of ovaries, bilateral: Secondary | ICD-10-CM | POA: Diagnosis not present

## 2019-07-07 DIAGNOSIS — E785 Hyperlipidemia, unspecified: Secondary | ICD-10-CM | POA: Insufficient documentation

## 2019-07-07 DIAGNOSIS — Z7982 Long term (current) use of aspirin: Secondary | ICD-10-CM | POA: Insufficient documentation

## 2019-07-07 DIAGNOSIS — Z90710 Acquired absence of both cervix and uterus: Secondary | ICD-10-CM | POA: Insufficient documentation

## 2019-07-07 LAB — CMP (CANCER CENTER ONLY)
ALT: 21 U/L (ref 0–44)
AST: 16 U/L (ref 15–41)
Albumin: 4.2 g/dL (ref 3.5–5.0)
Alkaline Phosphatase: 71 U/L (ref 38–126)
Anion gap: 10 (ref 5–15)
BUN: 17 mg/dL (ref 8–23)
CO2: 26 mmol/L (ref 22–32)
Calcium: 9.8 mg/dL (ref 8.9–10.3)
Chloride: 106 mmol/L (ref 98–111)
Creatinine: 0.73 mg/dL (ref 0.44–1.00)
GFR, Est AFR Am: >60 mL/min (ref >60)
GFR, Estimated: >60 mL/min (ref >60)
Glucose, Bld: 70 mg/dL (ref 70–99)
Potassium: 4.1 mmol/L (ref 3.5–5.1)
Sodium: 142 mmol/L (ref 135–145)
Total Bilirubin: 0.5 mg/dL (ref 0.3–1.2)
Total Protein: 7.1 g/dL (ref 6.5–8.1)

## 2019-07-07 LAB — VITAMIN D 25 HYDROXY (VIT D DEFICIENCY, FRACTURES): Vit D, 25-Hydroxy: 32.66 ng/mL (ref 30–100)

## 2019-07-07 LAB — CBC WITH DIFFERENTIAL (CANCER CENTER ONLY)
Abs Immature Granulocytes: 0.02 10*3/uL (ref 0.00–0.07)
Basophils Absolute: 0.1 10*3/uL (ref 0.0–0.1)
Basophils Relative: 1 %
Eosinophils Absolute: 0.3 10*3/uL (ref 0.0–0.5)
Eosinophils Relative: 5 %
HCT: 44.5 % (ref 36.0–46.0)
Hemoglobin: 14.6 g/dL (ref 12.0–15.0)
Immature Granulocytes: 0 %
Lymphocytes Relative: 31 %
Lymphs Abs: 1.9 10*3/uL (ref 0.7–4.0)
MCH: 30.4 pg (ref 26.0–34.0)
MCHC: 32.8 g/dL (ref 30.0–36.0)
MCV: 92.5 fL (ref 80.0–100.0)
Monocytes Absolute: 0.6 10*3/uL (ref 0.1–1.0)
Monocytes Relative: 11 %
Neutro Abs: 3.1 10*3/uL (ref 1.7–7.7)
Neutrophils Relative %: 52 %
Platelet Count: 279 10*3/uL (ref 150–400)
RBC: 4.81 MIL/uL (ref 3.87–5.11)
RDW: 12.9 % (ref 11.5–15.5)
WBC Count: 6 10*3/uL (ref 4.0–10.5)
nRBC: 0 % (ref 0.0–0.2)

## 2019-07-07 MED ORDER — ANASTROZOLE 1 MG PO TABS: 1.0000 mg | ORAL_TABLET | Freq: Every day | ORAL | 4 refills | Status: DC

## 2019-07-08 ENCOUNTER — Telehealth: Payer: Self-pay | Admitting: Oncology

## 2019-07-08 NOTE — Telephone Encounter (Signed)
Scheduled appts per 6/1 los. Pt confirmed appt date and time.

## 2019-10-06 ENCOUNTER — Other Ambulatory Visit: Payer: Self-pay

## 2019-10-06 ENCOUNTER — Ambulatory Visit
Admission: RE | Admit: 2019-10-06 | Discharge: 2019-10-06 | Disposition: A | Discharge status: Home / Self Care | Payer: Medicare Other | Source: Ambulatory Visit | Attending: Oncology | Admitting: Oncology

## 2019-10-06 DIAGNOSIS — C50512 Malignant neoplasm of lower-outer quadrant of left female breast: Secondary | ICD-10-CM

## 2020-05-17 ENCOUNTER — Other Ambulatory Visit: Payer: Self-pay

## 2020-05-17 ENCOUNTER — Ambulatory Visit (INDEPENDENT_AMBULATORY_CARE_PROVIDER_SITE_OTHER): Payer: Self-pay | Admitting: Plastic Surgery

## 2020-05-17 ENCOUNTER — Encounter: Payer: Self-pay | Admitting: Plastic Surgery

## 2020-05-17 VITALS — BP 159/89 | HR 85

## 2020-05-17 DIAGNOSIS — Z170 Estrogen receptor positive status [ER+]: Secondary | ICD-10-CM

## 2020-05-17 DIAGNOSIS — C50512 Malignant neoplasm of lower-outer quadrant of left female breast: Secondary | ICD-10-CM

## 2020-05-17 NOTE — Progress Notes (Signed)
Preoperative Dx: hyperpigmentation of face  Postoperative Dx:  same  Procedure: laser to face   Anesthesia: none  Description of Procedure:  Risks and complications were explained to the patient. Consent was confirmed and signed. Time out was called and all information was confirmed to be correct. The area  area was prepped with alcohol and wiped dry. The BLL laser was set at [facility] base. The face was lasered. The patient tolerated the procedure well and there were no complications. The patient is to follow up in 4 weeks.   She is interested in midface filler at next appointment.  She was given the dermal repair pack.

## 2020-07-19 ENCOUNTER — Other Ambulatory Visit: Payer: Self-pay | Admitting: Plastic Surgery

## 2020-08-16 ENCOUNTER — Other Ambulatory Visit: Payer: Self-pay | Admitting: Oncology

## 2020-08-16 ENCOUNTER — Other Ambulatory Visit: Payer: Self-pay | Admitting: *Deleted

## 2020-08-16 DIAGNOSIS — C50512 Malignant neoplasm of lower-outer quadrant of left female breast: Secondary | ICD-10-CM

## 2020-08-16 NOTE — Progress Notes (Signed)
[place]  [phone] [phone]) [phone]     ID: TRESSIA LABRUM DOB: [birth year]  MR#: [id number]  QQP#:[id number]  Patient Care Team: Willey Blade, MD as PCP - General (Internal Medicine) Marylan Glore, Virgie Dad, MD as Consulting Physician (Oncology) Fanny Skates, MD as Consulting Physician (General Surgery) Eppie Gibson, MD as Attending Physician (Radiation Oncology) Dasher, Rayvon Char, MD (Dermatology) Juanita Craver, MD as Consulting Physician (Gastroenterology) OTHER MD:   CHIEF COMPLAINT: estrogen receptor positive breast cancer  CURRENT TREATMENT: Completing 5 years of anastrozole   INTERVAL HISTORY: Stacey Barrett returns today for follow-up of her estrogen receptor positive breast cancer.   She stopped anastrozole in May 2022.  She has noted a little bit of an increase in her energy and sense of wellbeing but no other major change.  Her most recent bone density screening from 09/25/2018 showed a T-score of -1.7, which is considered osteopenic.  Since her last visit, she underwent bilateral diagnostic mammography with tomography at [facility] on 10/06/2019 showing: breast density category B; no evidence of malignancy in either breast.   REVIEW OF SYSTEMS: Stacey Barrett has a trainer, regularly does weights, and is on her feet all day at work she says.  A detailed review of systems today was otherwise stable.   COVID 19 VACCINATION STATUS: [facility] x2, most recently 04/2019; she had COVID May 2022, no booster as of July 2022   BREAST CANCER HISTORY: From the original intake note:  Stacey Barrett had routine lateral mammographic screening with tomography at the [facility] 05/12/2015 showing an area of possible asymmetry in the right breast and a possible mass in the left breast. Diagnostic right mammography with tomography and left breast ultrasonography was performed 05/20/2015. Breast density was category B. The area of concern in the posterior right breast  proved to be a 7 mm normal-appearing intramammary lymph node.  In the left breast however compression views showed a far posterior mass with irregular margins measuring 13 mm. This was palpable deep in the lower outer quadrant of the left breast. Ultrasonography of the left breastconfirmed a 1.6 cm hypoechoic mass with irregular margins at the 5:00 position 3 cm from the nipple. Ultrasound of the axilla showed no suspicious findings.  Biopsy of the left breast mass in question 05/30/2015 showed ([id number]) and invasive ductal carcinoma, grade 2, estrogen receptor 100% positive, addition receptor 95% positive, both with strong staining intensity, with an MIB-1 of 25%, and no HER-2 amplification, the signals ratio being 1.27 and the number per cell 5.55.  Her subsequent treatment is as detailed below   PAST MEDICAL HISTORY: Past Medical History:  Diagnosis Date   Arthritis    Breast cancer ([place])    Breast cancer of lower-outer quadrant of left female breast ([place]) 06/01/2015   Depression    Hyperlipidemia    Personal history of radiation therapy    Skin cancer    05/2013    PAST SURGICAL HISTORY: Past Surgical History:  Procedure Laterality Date   ABDOMINAL HYSTERECTOMY     BREAST BIOPSY     BREAST LUMPECTOMY Left    2017   CHOLECYSTECTOMY     FACIAL COSMETIC SURGERY     fatty tumor Left    under left arm   RADIOACTIVE SEED GUIDED PARTIAL MASTECTOMY WITH AXILLARY SENTINEL LYMPH NODE BIOPSY Left 07/11/2015   Procedure: INJECT BLUE DYE LEFT BREAST, RADIOACTIVE SEED GUIDED LEFT BREAST LUMPECTOMY WITH LEFT AXILLARY SENTINEL LYMPH NODE BIOPSY ;  Surgeon: Fanny Skates, MD;  Location: [facility] OR;  Service: General;  Laterality: Left;    FAMILY HISTORY Family History  Problem Relation Age of Onset   Breast cancer Mother        dx. between 67-75; s/p lumpectomy   Colon cancer Maternal Uncle 67   Skin cancer Maternal Uncle        NOS type   Anemia Father        later in life   Lupus  Sister    Other Sister        hysterectomy at a "young age" for unspecified reason   Skin cancer Maternal Grandfather        NOS type   Skin cancer Son        NOS type - on back   Breast cancer Sister 47       will have genetic testing soon; lives in [place]   Multiple myeloma Sister        dx. 66-67   Skin cancer Sister        NOS type   Breast cancer Other 61       maternal great aunt (MGM's sister)   the patient's father died at the age of 73. The patient's mother died at the age of 40. Stacey Barrett had no brothers, 3 sisters. Her mother was diagnosed with breast cancer at age 23. A maternal uncle was diagnosed with colon cancer. In addition one of Lamyah's sisters was diagnosed with breast cancer at age 79. A second sister has a history of multiple myeloma.   GYNECOLOGIC HISTORY:  No LMP recorded. Patient has had a hysterectomy. Menarche age 35, first live birth age 60, she is Stacey Barrett P3. She underwent total abdominal hysterectomy with bilateral salpingo-oophorectomy age 1. She took hormone replacement about 3 months and then developed a pulmonary embolus. However she has a history of approximately 8 years on oral contraceptives remotely with no clots or other complications.   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 She is widowed and lives alone with a standard [name] [place] terrier. Her sons Stacey Barrett. Son Stacey Barrett and he is a Scientist, research (physical sciences). The patient has 3 grandchildren. She is not a church attender    ADVANCED DIRECTIVES: in place. The patient's son Stacey Barrett is her healthcare power of attorney. He can be reached at [phone].   HEALTH MAINTENANCE: Social History   Tobacco Use   Smoking status: Former    Packs/day: 1.00    Years: 14.00    Pack years: 14.00    Types: Cigarettes    Quit date: 02/05/1973    Years since quitting: 47.5   Smokeless tobacco: Never  Substance  Use Topics   Alcohol use: Yes    Comment: ocassional   Drug use: No     Colonoscopy:  PAP:  Bone density:  Lipid panel:  No Known Allergies  Current Outpatient Medications  Medication Sig Dispense Refill   anastrozole (ARIMIDEX) 1 MG tablet Take 1 tablet (1 mg total) by mouth daily. 90 tablet 4   aspirin EC 81 MG tablet Take 81 mg by mouth daily.     cholecalciferol (VITAMIN D) 1000 units tablet Take 1 tablet (1,000 Units total) by mouth daily. 100 tablet 4   gabapentin (NEURONTIN) 300 MG capsule Take 1 capsule (300 mg total) by mouth at bedtime. 90 capsule 4   ibuprofen (ADVIL,MOTRIN) 200 MG tablet Take  400-600 mg by mouth at bedtime as needed for moderate pain.     No current facility-administered medications for this visit.    OBJECTIVE: White woman who appears younger than stated age  6:   08/17/20 0856  BP: (!) 149/72  Pulse: 73  Resp: 18  Temp: 98.1 F (36.7 C)  SpO2: 98%      Body mass index is 23.6 kg/m.    ECOG FS:1 - Symptomatic but completely ambulatory  Sclerae unicteric, EOMs intact Wearing a mask No cervical or supraclavicular adenopathy Lungs no rales or rhonchi Heart regular rate and rhythm Abd soft, nontender, positive bowel sounds MSK no focal spinal tenderness, no upper extremity lymphedema Neuro: nonfocal, well oriented, appropriate affect Breasts: The right breast is benign.  The left breast with notes postlumpectomy with no evidence of disease recurrence.  Both axillae are benign.   LAB RESULTS:  CMP     Component Value Date/Time   NA 141 08/17/2020 0834   NA 141 05/31/2016 1400   K 4.5 08/17/2020 0834   K 3.9 05/31/2016 1400   CL 108 08/17/2020 0834   CO2 24 08/17/2020 0834   CO2 26 05/31/2016 1400   GLUCOSE 90 08/17/2020 0834   GLUCOSE 107 05/31/2016 1400   BUN 19 08/17/2020 0834   BUN 18.9 05/31/2016 1400   CREATININE 0.74 08/17/2020 0834   CREATININE 0.8 05/31/2016 1400   CALCIUM 9.6 08/17/2020 0834   CALCIUM 10.0  05/31/2016 1400   PROT 6.9 08/17/2020 0834   PROT 7.2 05/31/2016 1400   ALBUMIN 3.9 08/17/2020 0834   ALBUMIN 4.2 05/31/2016 1400   AST 14 (L) 08/17/2020 0834   AST 12 05/31/2016 1400   ALT 14 08/17/2020 0834   ALT 14 05/31/2016 1400   ALKPHOS 65 08/17/2020 0834   ALKPHOS 85 05/31/2016 1400   BILITOT 0.5 08/17/2020 0834   BILITOT 0.52 05/31/2016 1400   GFRNONAA >60 08/17/2020 0834   GFRAA >60 07/07/2019 0936    INo results found for: SPEP, UPEP  Lab Results  Component Value Date   WBC 5.7 08/17/2020   NEUTROABS 3.0 08/17/2020   HGB 13.9 08/17/2020   HCT 41.0 08/17/2020   MCV 90.1 08/17/2020   PLT 248 08/17/2020      Chemistry      Component Value Date/Time   NA 141 08/17/2020 0834   NA 141 05/31/2016 1400   K 4.5 08/17/2020 0834   K 3.9 05/31/2016 1400   CL 108 08/17/2020 0834   CO2 24 08/17/2020 0834   CO2 26 05/31/2016 1400   BUN 19 08/17/2020 0834   BUN 18.9 05/31/2016 1400   CREATININE 0.74 08/17/2020 0834   CREATININE 0.8 05/31/2016 1400      Component Value Date/Time   CALCIUM 9.6 08/17/2020 0834   CALCIUM 10.0 05/31/2016 1400   ALKPHOS 65 08/17/2020 0834   ALKPHOS 85 05/31/2016 1400   AST 14 (L) 08/17/2020 0834   AST 12 05/31/2016 1400   ALT 14 08/17/2020 0834   ALT 14 05/31/2016 1400   BILITOT 0.5 08/17/2020 0834   BILITOT 0.52 05/31/2016 1400       No results found for: LABCA2  No components found for: LABCA125  No results for input(s): INR in the last 168 hours.  Urinalysis    Component Value Date/Time   COLORURINE RED (A) 01/13/2014 0925   APPEARANCEUR TURBID (A) 01/13/2014 0925   LABSPEC 1.037 (H) 01/13/2014 0925   PHURINE 5.5 01/13/2014 0925   GLUCOSEU NEGATIVE 01/13/2014  [place] (A) 01/13/2014 0925   BILIRUBINUR SMALL (A) 01/13/2014 0925   KETONESUR NEGATIVE 01/13/2014 0925   PROTEINUR >300 (A) 01/13/2014 0925   UROBILINOGEN 1.0 01/13/2014 0925   NITRITE NEGATIVE 01/13/2014 0925   LEUKOCYTESUR SMALL (A) 01/13/2014  0925    ELIGIBLE FOR AVAILABLE RESEARCH PROTOCOL: no  STUDIES: No results found.    ASSESSMENT: 75 y.o. [place] woman status post left breast lower outer quadrant Biopsy 05/20/2015 for a clinical T1c N0, stage IA invasive ductal carcinoma, grade 2, strongly estrogen and progesterone receptor positive, HER-2 nonamplified, with an MIB-1 of 25%  (1) left lumpectomy and sentinel lymph node sampling 07/11/2015 showed a pT1b pN0, stage IA invasive ductal carcinoma, grade 2, with repeat HER-2 again negative  (2) Oncotype DX score of 17 predicts a risk of recurrence outside the breast within 10 years of 11% if the patient's only systemic therapy is tamoxifen for 5 years. It also predicts no benefit from chemotherapy.  (3) adjuvant radiation 08-29-15  To 09-19-15:   Left breast / 42.56 Gy in 16 fractions                        (4) anastrozole started 09/29/2015, completing five years May 2022.  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  This is very favorable.  She completed 5 years of anastrozole.  In her case I do not see any benefit in continuing an additional 2years.  Accordingly she is done with treatment and ready to "graduate".  All she will need in terms of breast cancer follow-up is her yearly mammography and her yearly physician breast exam.  I will be glad to see her again at any point in the future if and when the  need arises but as of now are making no further routine appointments for her here.  Total encounter time 25 minutes. Total encounter time 20 minutes.*   Ryley Teater, Virgie Dad, MD  08/17/20 9:10 AM Medical Oncology and Hematology [facility] [place], [place] [address] Tel. [phone]    Fax. [phone]   I, Wilburn Mylar, am acting as scribe for Dr. Virgie Dad. Iantha Titsworth.  I, Lurline Del MD, have reviewed the above documentation for accuracy and completeness, and I agree with the above.   *Total Encounter Time as defined by the Centers for Medicare and Medicaid Services includes, in addition to the face-to-face time of a patient visit (documented in the note above) non-face-to-face time: obtaining and reviewing outside history, ordering and reviewing medications, tests or procedures, care coordination (communications with other health care professionals or caregivers) and documentation in the medical record.

## 2020-08-17 ENCOUNTER — Inpatient Hospital Stay: Payer: Medicare Other | Attending: Oncology | Admitting: Oncology

## 2020-08-17 ENCOUNTER — Other Ambulatory Visit: Payer: Self-pay

## 2020-08-17 ENCOUNTER — Inpatient Hospital Stay: Payer: Medicare Other

## 2020-08-17 VITALS — BP 149/72 | HR 73 | Temp 98.1°F | Resp 18 | Ht 70.0 in | Wt 164.5 lb

## 2020-08-17 DIAGNOSIS — Z85828 Personal history of other malignant neoplasm of skin: Secondary | ICD-10-CM | POA: Diagnosis not present

## 2020-08-17 DIAGNOSIS — Z800 Family history of malignant neoplasm of digestive organs: Secondary | ICD-10-CM | POA: Insufficient documentation

## 2020-08-17 DIAGNOSIS — C50512 Malignant neoplasm of lower-outer quadrant of left female breast: Secondary | ICD-10-CM | POA: Diagnosis present

## 2020-08-17 DIAGNOSIS — M818 Other osteoporosis without current pathological fracture: Secondary | ICD-10-CM | POA: Diagnosis not present

## 2020-08-17 DIAGNOSIS — Z170 Estrogen receptor positive status [ER+]: Secondary | ICD-10-CM | POA: Diagnosis not present

## 2020-08-17 DIAGNOSIS — Z87891 Personal history of nicotine dependence: Secondary | ICD-10-CM | POA: Insufficient documentation

## 2020-08-17 DIAGNOSIS — Z803 Family history of malignant neoplasm of breast: Secondary | ICD-10-CM | POA: Insufficient documentation

## 2020-08-17 DIAGNOSIS — Z806 Family history of leukemia: Secondary | ICD-10-CM | POA: Diagnosis not present

## 2020-08-17 DIAGNOSIS — Z79811 Long term (current) use of aromatase inhibitors: Secondary | ICD-10-CM | POA: Insufficient documentation

## 2020-08-17 DIAGNOSIS — E785 Hyperlipidemia, unspecified: Secondary | ICD-10-CM | POA: Diagnosis not present

## 2020-08-17 DIAGNOSIS — Z7982 Long term (current) use of aspirin: Secondary | ICD-10-CM | POA: Insufficient documentation

## 2020-08-17 DIAGNOSIS — Z923 Personal history of irradiation: Secondary | ICD-10-CM | POA: Diagnosis not present

## 2020-08-17 LAB — CMP (CANCER CENTER ONLY)
ALT: 14 U/L (ref 0–44)
AST: 14 U/L — ABNORMAL LOW (ref 15–41)
Albumin: 3.9 g/dL (ref 3.5–5.0)
Alkaline Phosphatase: 65 U/L (ref 38–126)
Anion gap: 9 (ref 5–15)
BUN: 19 mg/dL (ref 8–23)
CO2: 24 mmol/L (ref 22–32)
Calcium: 9.6 mg/dL (ref 8.9–10.3)
Chloride: 108 mmol/L (ref 98–111)
Creatinine: 0.74 mg/dL (ref 0.44–1.00)
GFR, Estimated: >60 mL/min (ref >60)
Glucose, Bld: 90 mg/dL (ref 70–99)
Potassium: 4.5 mmol/L (ref 3.5–5.1)
Sodium: 141 mmol/L (ref 135–145)
Total Bilirubin: 0.5 mg/dL (ref 0.3–1.2)
Total Protein: 6.9 g/dL (ref 6.5–8.1)

## 2020-08-17 LAB — CBC WITH DIFFERENTIAL (CANCER CENTER ONLY)
Abs Immature Granulocytes: 0.02 10*3/uL (ref 0.00–0.07)
Basophils Absolute: 0.1 10*3/uL (ref 0.0–0.1)
Basophils Relative: 1 %
Eosinophils Absolute: 0.3 10*3/uL (ref 0.0–0.5)
Eosinophils Relative: 5 %
HCT: 41 % (ref 36.0–46.0)
Hemoglobin: 13.9 g/dL (ref 12.0–15.0)
Immature Granulocytes: 0 %
Lymphocytes Relative: 30 %
Lymphs Abs: 1.7 10*3/uL (ref 0.7–4.0)
MCH: 30.5 pg (ref 26.0–34.0)
MCHC: 33.9 g/dL (ref 30.0–36.0)
MCV: 90.1 fL (ref 80.0–100.0)
Monocytes Absolute: 0.6 10*3/uL (ref 0.1–1.0)
Monocytes Relative: 11 %
Neutro Abs: 3 10*3/uL (ref 1.7–7.7)
Neutrophils Relative %: 53 %
Platelet Count: 248 10*3/uL (ref 150–400)
RBC: 4.55 MIL/uL (ref 3.87–5.11)
RDW: 13.7 % (ref 11.5–15.5)
WBC Count: 5.7 10*3/uL (ref 4.0–10.5)
nRBC: 0 % (ref 0.0–0.2)

## 2020-08-30 ENCOUNTER — Other Ambulatory Visit (HOSPITAL_COMMUNITY): Payer: Self-pay | Admitting: Internal Medicine

## 2020-09-05 ENCOUNTER — Ambulatory Visit (HOSPITAL_BASED_OUTPATIENT_CLINIC_OR_DEPARTMENT_OTHER)
Admission: RE | Admit: 2020-09-05 | Discharge: 2020-09-05 | Disposition: A | Discharge status: Home / Self Care | Payer: Medicare Other | Source: Ambulatory Visit | Attending: Internal Medicine | Admitting: Internal Medicine

## 2020-09-05 ENCOUNTER — Other Ambulatory Visit: Payer: Self-pay

## 2020-09-05 DIAGNOSIS — E782 Mixed hyperlipidemia: Secondary | ICD-10-CM | POA: Insufficient documentation

## 2021-02-27 ENCOUNTER — Ambulatory Visit
Admission: RE | Admit: 2021-02-27 | Discharge: 2021-02-27 | Disposition: A | Discharge status: Home / Self Care | Payer: Medicare Other | Source: Ambulatory Visit | Attending: Oncology | Admitting: Oncology

## 2021-02-27 ENCOUNTER — Other Ambulatory Visit: Payer: Self-pay | Admitting: Internal Medicine

## 2021-02-27 ENCOUNTER — Telehealth: Payer: Self-pay | Admitting: *Deleted

## 2021-02-27 DIAGNOSIS — Z170 Estrogen receptor positive status [ER+]: Secondary | ICD-10-CM

## 2021-02-27 DIAGNOSIS — N6320 Unspecified lump in the left breast, unspecified quadrant: Secondary | ICD-10-CM

## 2021-02-27 DIAGNOSIS — C50512 Malignant neoplasm of lower-outer quadrant of left female breast: Secondary | ICD-10-CM

## 2021-02-27 DIAGNOSIS — M818 Other osteoporosis without current pathological fracture: Secondary | ICD-10-CM

## 2021-02-27 NOTE — Telephone Encounter (Signed)
This RN received bone density report ordered per Dr Jannifer Rodney due to aromatase inhibitor use and completion July 2022.  This RN called the patient to discuss need for follow up with her primary care- verified as Dr Karlton Lemon and pt state she has an appointment next month.  Bone density report forwarded to Dr Karlton Lemon.

## 2021-02-28 ENCOUNTER — Other Ambulatory Visit: Payer: Self-pay | Admitting: Internal Medicine

## 2021-02-28 DIAGNOSIS — R921 Mammographic calcification found on diagnostic imaging of breast: Secondary | ICD-10-CM

## 2021-03-15 ENCOUNTER — Ambulatory Visit
Admission: RE | Admit: 2021-03-15 | Discharge: 2021-03-15 | Disposition: A | Discharge status: Home / Self Care | Payer: Medicare Other | Source: Ambulatory Visit | Attending: Internal Medicine | Admitting: Internal Medicine

## 2021-03-15 DIAGNOSIS — R921 Mammographic calcification found on diagnostic imaging of breast: Secondary | ICD-10-CM

## 2021-03-15 DIAGNOSIS — N6320 Unspecified lump in the left breast, unspecified quadrant: Secondary | ICD-10-CM

## 2021-07-26 ENCOUNTER — Other Ambulatory Visit: Payer: Self-pay | Admitting: Internal Medicine

## 2021-07-26 DIAGNOSIS — R2241 Localized swelling, mass and lump, right lower limb: Secondary | ICD-10-CM

## 2021-07-31 ENCOUNTER — Ambulatory Visit
Admission: RE | Admit: 2021-07-31 | Discharge: 2021-07-31 | Disposition: A | Discharge status: Home / Self Care | Payer: Medicare Other | Source: Ambulatory Visit | Attending: Internal Medicine | Admitting: Internal Medicine

## 2021-07-31 DIAGNOSIS — R2241 Localized swelling, mass and lump, right lower limb: Secondary | ICD-10-CM

## 2021-10-10 ENCOUNTER — Other Ambulatory Visit: Payer: Self-pay | Admitting: Internal Medicine

## 2021-10-10 DIAGNOSIS — Z1231 Encounter for screening mammogram for malignant neoplasm of breast: Secondary | ICD-10-CM

## 2021-10-24 ENCOUNTER — Ambulatory Visit
Admission: RE | Admit: 2021-10-24 | Discharge: 2021-10-24 | Disposition: A | Discharge status: Home / Self Care | Payer: Medicare Other | Source: Ambulatory Visit | Attending: Internal Medicine | Admitting: Internal Medicine

## 2021-10-24 ENCOUNTER — Other Ambulatory Visit: Payer: Self-pay | Admitting: Internal Medicine

## 2021-10-24 DIAGNOSIS — M7989 Other specified soft tissue disorders: Secondary | ICD-10-CM

## 2021-10-24 DIAGNOSIS — Z1231 Encounter for screening mammogram for malignant neoplasm of breast: Secondary | ICD-10-CM

## 2021-10-24 DIAGNOSIS — R921 Mammographic calcification found on diagnostic imaging of breast: Secondary | ICD-10-CM

## 2022-02-22 ENCOUNTER — Other Ambulatory Visit: Payer: Self-pay | Admitting: General Surgery

## 2022-02-22 DIAGNOSIS — R2241 Localized swelling, mass and lump, right lower limb: Secondary | ICD-10-CM

## 2022-02-28 ENCOUNTER — Ambulatory Visit
Admission: RE | Admit: 2022-02-28 | Discharge: 2022-02-28 | Disposition: A | Discharge status: Home / Self Care | Payer: Medicare Other | Source: Ambulatory Visit | Attending: General Surgery | Admitting: General Surgery

## 2022-02-28 DIAGNOSIS — R2241 Localized swelling, mass and lump, right lower limb: Secondary | ICD-10-CM

## 2022-03-02 ENCOUNTER — Ambulatory Visit
Admission: RE | Admit: 2022-03-02 | Discharge: 2022-03-02 | Disposition: A | Discharge status: Home / Self Care | Payer: Medicare Other | Source: Ambulatory Visit | Attending: Internal Medicine | Admitting: Internal Medicine

## 2022-03-02 DIAGNOSIS — M7989 Other specified soft tissue disorders: Secondary | ICD-10-CM

## 2022-03-02 DIAGNOSIS — R921 Mammographic calcification found on diagnostic imaging of breast: Secondary | ICD-10-CM

## 2022-04-13 IMAGING — MG DIGITAL DIAGNOSTIC BILAT W/ TOMO W/ CAD
6 of 10 series · 6 of 26 positions shown · non-contrast
Comparison: Previous exams.

CLINICAL DATA: 74-year-old female with history of left breast
cancer post lumpectomy 07/11/2015.

EXAM:
DIGITAL DIAGNOSTIC BILATERAL MAMMOGRAM WITH TOMO AND CAD

[L CC (1 of 2)]
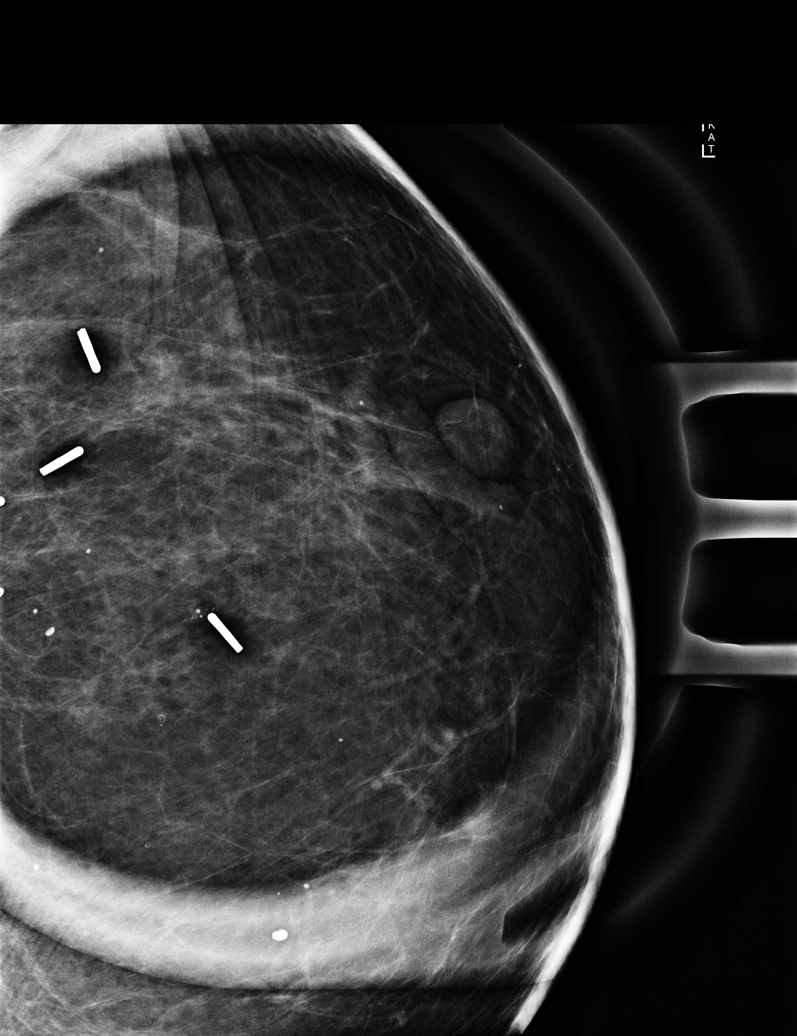

[L CC (2 of 2)]
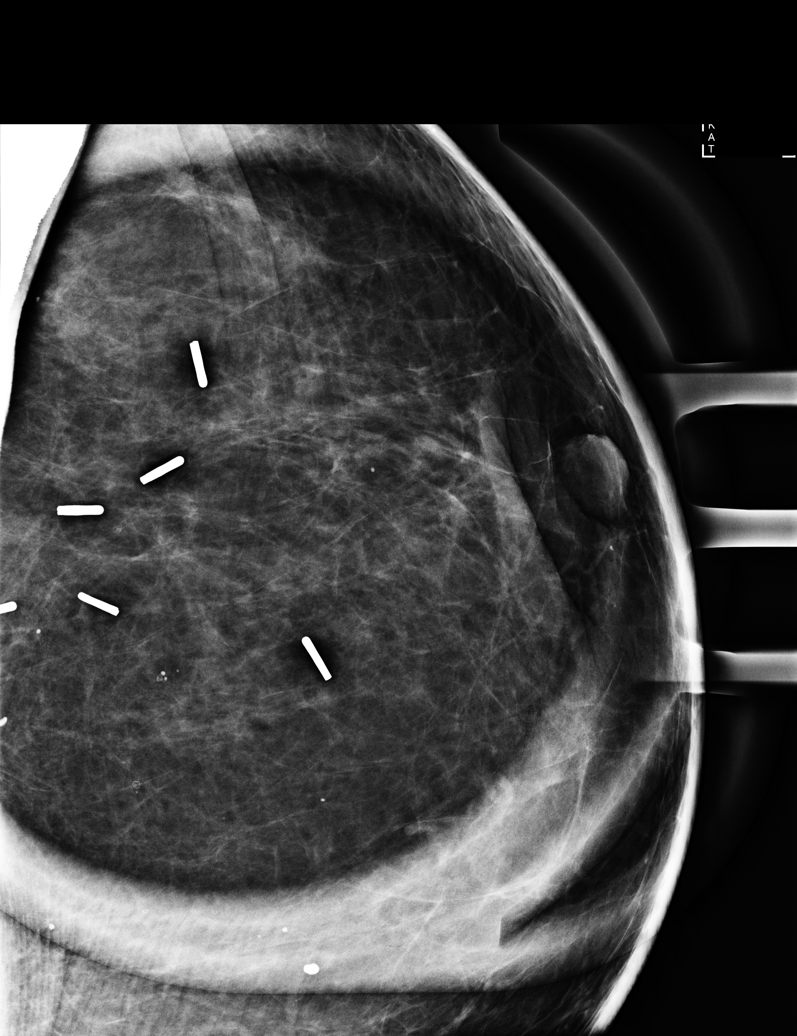

[L MLO synth-2D]
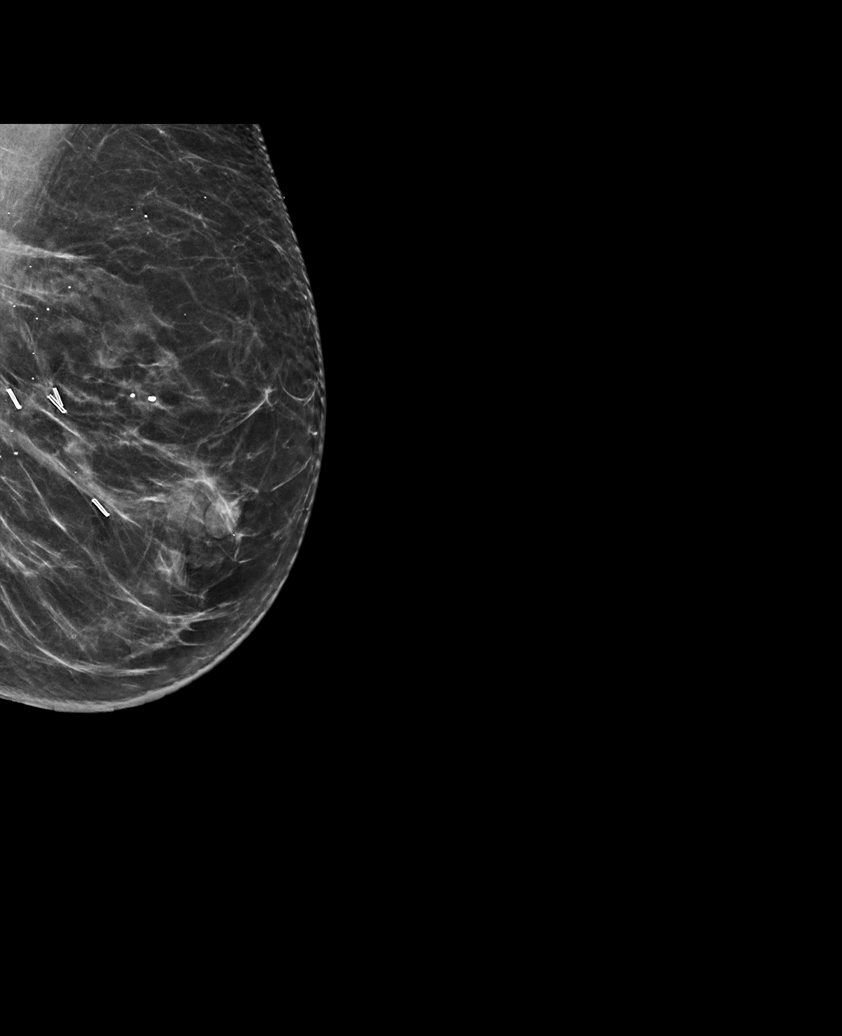

[L CC synth-2D]
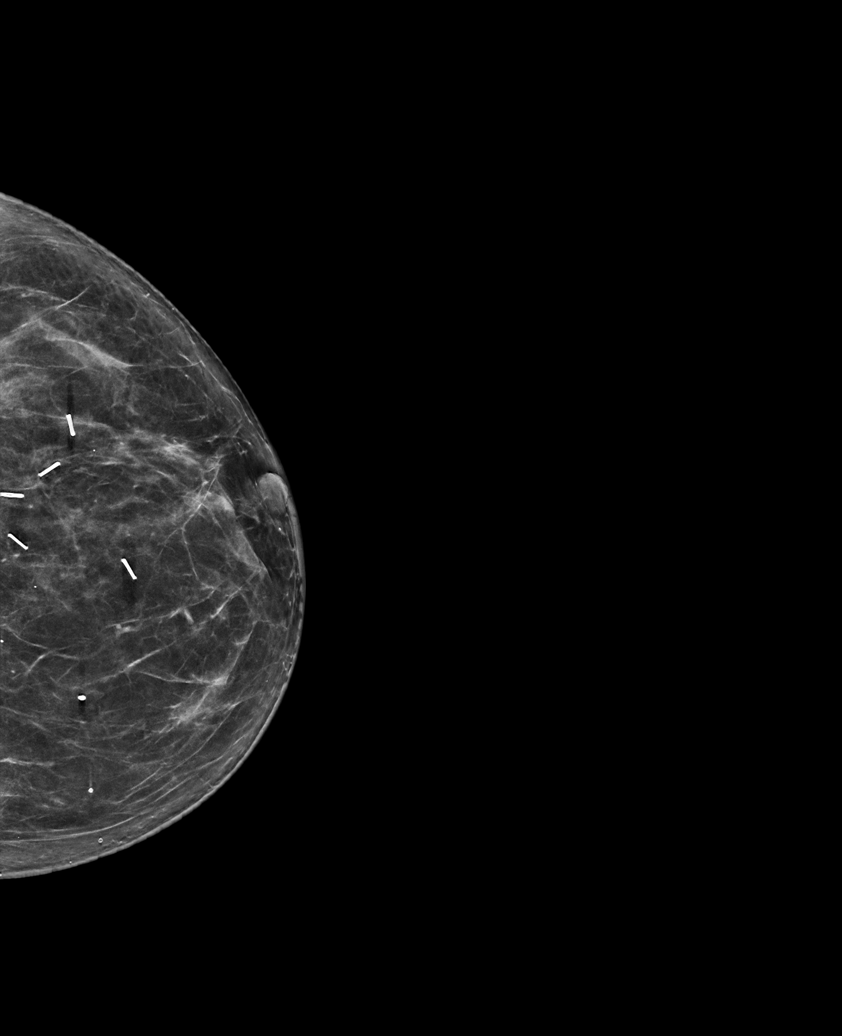

[R CC synth-2D]
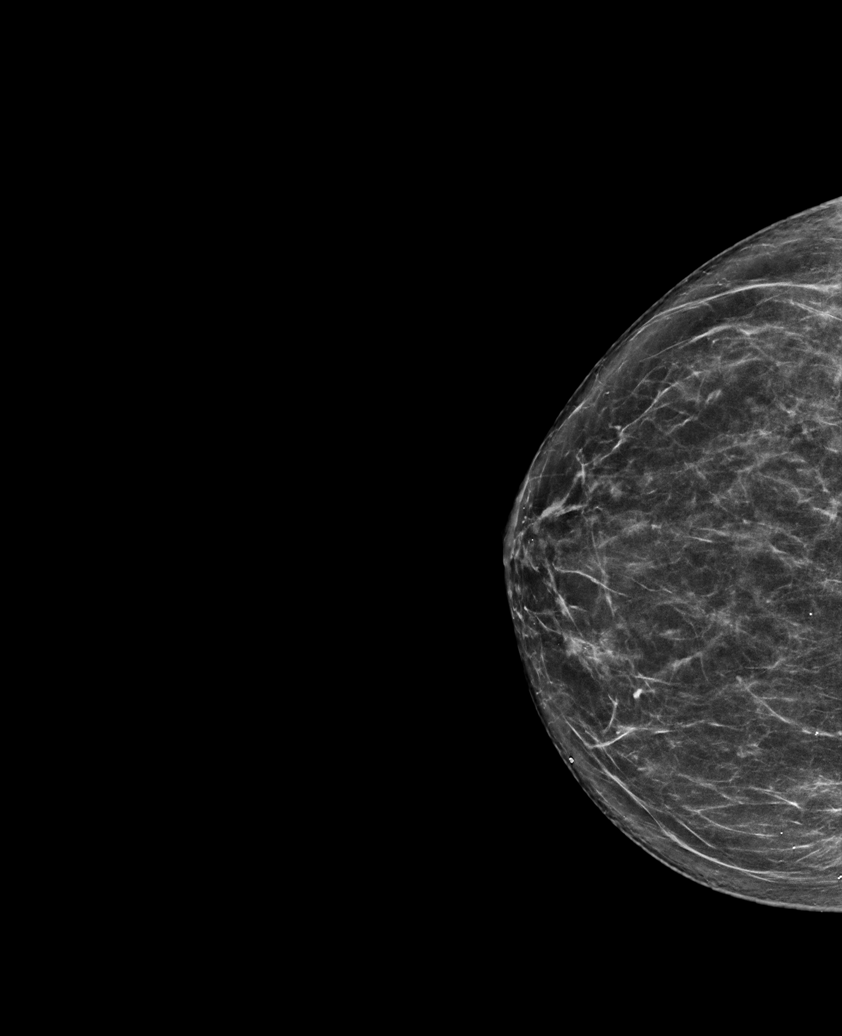

[R MLO synth-2D]
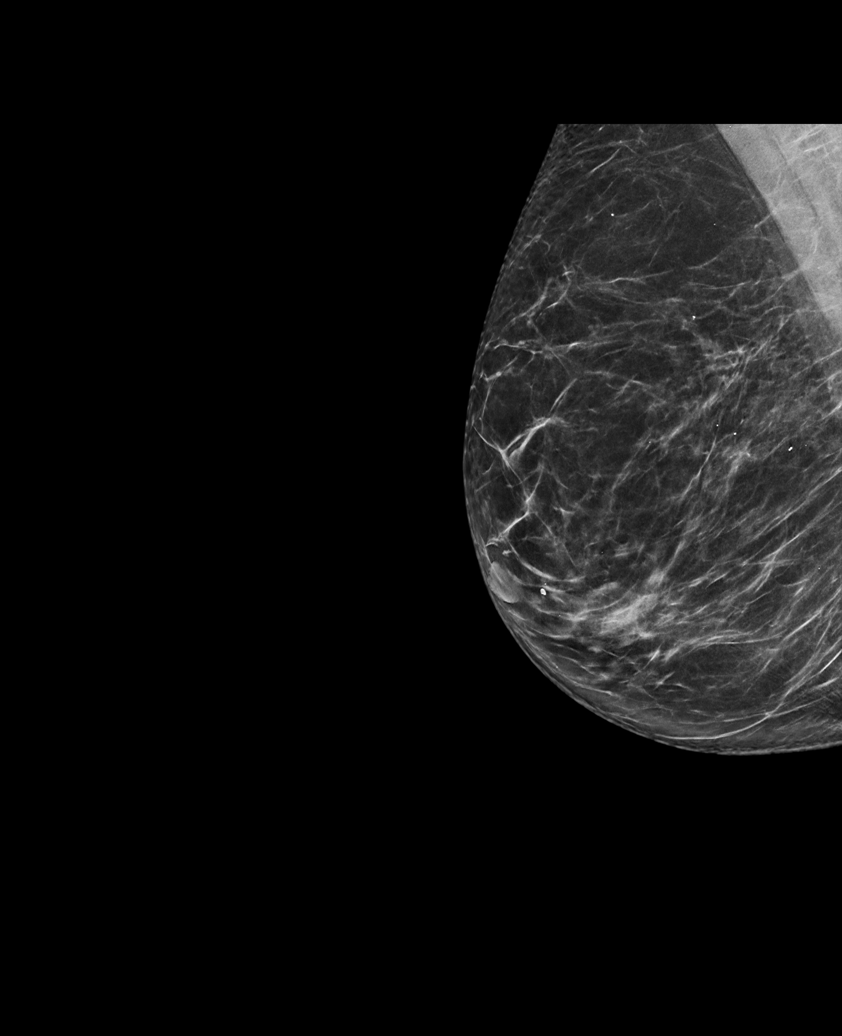

[6 of 26 positions shown; findings below may reference images not displayed]

ACR Breast Density Category b: There are scattered areas of
fibroglandular density.
FINDINGS: No suspicious masses or calcifications are seen in either breast.
Lumpectomy changes again identified in the central posterior left
breast. Spot compression magnification cc views of the left breast
lumpectomy site were performed. There is no mammographic evidence of
locally recurrent malignancy.

Mammographic images were processed with CAD.
IMPRESSION: Stable left breast lumpectomy site. No mammographic evidence of
malignancy in either breast.

RECOMMENDATION:
Diagnostic mammogram is suggested in 1 year. (Code:VX-6-66H)

I have discussed the findings and recommendations with the patient.
If applicable, a reminder letter will be sent to the patient
regarding the next appointment.

BI-RADS CATEGORY  2: Benign.

## 2022-06-13 ENCOUNTER — Ambulatory Visit: Payer: Self-pay | Admitting: General Surgery

## 2022-06-27 ENCOUNTER — Encounter (HOSPITAL_BASED_OUTPATIENT_CLINIC_OR_DEPARTMENT_OTHER): Payer: Self-pay | Admitting: General Surgery

## 2022-07-03 MED ORDER — CHLORHEXIDINE GLUCONATE CLOTH 2 % EX PADS: 6.0000 | MEDICATED_PAD | Freq: Once | CUTANEOUS | Status: DC

## 2022-07-03 NOTE — Progress Notes (Signed)

## 2022-07-05 NOTE — Anesthesia Preprocedure Evaluation (Addendum)
Anesthesia Evaluation  Patient identified by MRN, date of birth, ID band Patient awake    Reviewed: Allergy & Precautions, NPO status , Patient's Chart, lab work & pertinent test results  History of Anesthesia Complications Negative for: history of anesthetic complications  Airway Mallampati: II  TM Distance: >3 FB Neck ROM: Full    Dental  (+) Dental Advisory Given   Pulmonary neg shortness of breath, neg sleep apnea, neg COPD, neg recent URI, former smoker   Pulmonary exam normal breath sounds clear to auscultation       Cardiovascular (-) hypertension(-) angina (-) Past MI, (-) Cardiac Stents and (-) CABG  Rhythm:Regular Rate:Normal  HLD   Neuro/Psych  PSYCHIATRIC DISORDERS  Depression       GI/Hepatic negative GI ROS, Neg liver ROS,,,  Endo/Other  negative endocrine ROS    Renal/GU negative Renal ROS     Musculoskeletal  (+) Arthritis ,    Abdominal   Peds  Hematology negative hematology ROS (+)   Anesthesia Other Findings H/o left breast cancer  Reproductive/Obstetrics                             Anesthesia Physical Anesthesia Plan  ASA: 2  Anesthesia Plan: General   Post-op Pain Management: Tylenol PO (pre-op)*   Induction: Intravenous  PONV Risk Score and Plan: 3 and Ondansetron, Dexamethasone and Treatment may vary due to age or medical condition  Airway Management Planned: LMA  Additional Equipment:   Intra-op Plan:   Post-operative Plan: Extubation in OR  Informed Consent: I have reviewed the patients History and Physical, chart, labs and discussed the procedure including the risks, benefits and alternatives for the proposed anesthesia with the patient or authorized representative who has indicated his/her understanding and acceptance.     Dental advisory given  Plan Discussed with: CRNA and Anesthesiologist  Anesthesia Plan Comments: (Risks of general  anesthesia discussed including, but not limited to, sore throat, hoarse voice, chipped/damaged teeth, injury to vocal cords, nausea and vomiting, allergic reactions, lung infection, heart attack, stroke, and death. All questions answered. )       Anesthesia Quick Evaluation

## 2022-07-06 ENCOUNTER — Ambulatory Visit (HOSPITAL_BASED_OUTPATIENT_CLINIC_OR_DEPARTMENT_OTHER): Payer: Medicare Other | Admitting: Anesthesiology

## 2022-07-06 ENCOUNTER — Other Ambulatory Visit: Payer: Self-pay

## 2022-07-06 ENCOUNTER — Encounter (HOSPITAL_BASED_OUTPATIENT_CLINIC_OR_DEPARTMENT_OTHER): Payer: Self-pay | Admitting: General Surgery

## 2022-07-06 ENCOUNTER — Ambulatory Visit (HOSPITAL_BASED_OUTPATIENT_CLINIC_OR_DEPARTMENT_OTHER)
Admission: RE | Admit: 2022-07-06 | Discharge: 2022-07-06 | Disposition: A | Discharge status: Home / Self Care | Payer: Medicare Other | Attending: General Surgery | Admitting: General Surgery

## 2022-07-06 ENCOUNTER — Encounter (HOSPITAL_BASED_OUTPATIENT_CLINIC_OR_DEPARTMENT_OTHER)
Admission: RE | Disposition: A | Discharge status: Home / Self Care | Payer: Self-pay | Source: Home / Self Care | Attending: General Surgery

## 2022-07-06 DIAGNOSIS — Z87891 Personal history of nicotine dependence: Secondary | ICD-10-CM | POA: Diagnosis not present

## 2022-07-06 DIAGNOSIS — E785 Hyperlipidemia, unspecified: Secondary | ICD-10-CM | POA: Diagnosis not present

## 2022-07-06 DIAGNOSIS — Z853 Personal history of malignant neoplasm of breast: Secondary | ICD-10-CM

## 2022-07-06 DIAGNOSIS — M1990 Unspecified osteoarthritis, unspecified site: Secondary | ICD-10-CM | POA: Diagnosis not present

## 2022-07-06 DIAGNOSIS — I10 Essential (primary) hypertension: Secondary | ICD-10-CM | POA: Insufficient documentation

## 2022-07-06 DIAGNOSIS — R2241 Localized swelling, mass and lump, right lower limb: Secondary | ICD-10-CM

## 2022-07-06 DIAGNOSIS — Z01818 Encounter for other preprocedural examination: Secondary | ICD-10-CM

## 2022-07-06 DIAGNOSIS — M67451 Ganglion, right hip: Secondary | ICD-10-CM | POA: Insufficient documentation

## 2022-07-06 DIAGNOSIS — Z8249 Family history of ischemic heart disease and other diseases of the circulatory system: Secondary | ICD-10-CM | POA: Diagnosis not present

## 2022-07-06 HISTORY — PX: MASS EXCISION: SHX2000

## 2022-07-06 SURGERY — EXCISION MASS
Anesthesia: General | Site: Leg Upper | Laterality: Right

## 2022-07-06 MED ORDER — FENTANYL CITRATE (PF) 250 MCG/5ML IJ SOLN
INTRAMUSCULAR | Status: DC | PRN
  Administered 2022-07-06: 50 ug via INTRAVENOUS

## 2022-07-06 MED ORDER — CEFAZOLIN SODIUM-DEXTROSE 2-4 GM/100ML-% IV SOLN
2.0000 g | INTRAVENOUS | Status: AC
  Administered 2022-07-06: 2 g via INTRAVENOUS

## 2022-07-06 MED ORDER — ACETAMINOPHEN 500 MG PO TABS
ORAL_TABLET | ORAL | Status: AC
  Filled 2022-07-06: qty 2

## 2022-07-06 MED ORDER — GABAPENTIN 300 MG PO CAPS
ORAL_CAPSULE | ORAL | Status: AC
  Filled 2022-07-06: qty 1

## 2022-07-06 MED ORDER — DEXAMETHASONE SODIUM PHOSPHATE 10 MG/ML IJ SOLN
INTRAMUSCULAR | Status: DC | PRN
  Administered 2022-07-06: 4 mg via INTRAVENOUS

## 2022-07-06 MED ORDER — OXYCODONE HCL 5 MG/5ML PO SOLN: 5.0000 mg | Freq: Once | ORAL | Status: DC | PRN

## 2022-07-06 MED ORDER — FENTANYL CITRATE (PF) 100 MCG/2ML IJ SOLN
INTRAMUSCULAR | Status: AC
  Filled 2022-07-06: qty 2

## 2022-07-06 MED ORDER — EPHEDRINE SULFATE (PRESSORS) 50 MG/ML IJ SOLN
INTRAMUSCULAR | Status: DC | PRN
  Administered 2022-07-06: 5 mg via INTRAVENOUS

## 2022-07-06 MED ORDER — GABAPENTIN 300 MG PO CAPS
300.0000 mg | ORAL_CAPSULE | ORAL | Status: AC
  Administered 2022-07-06: 300 mg via ORAL

## 2022-07-06 MED ORDER — ACETAMINOPHEN 500 MG PO TABS
1000.0000 mg | ORAL_TABLET | ORAL | Status: AC
  Administered 2022-07-06: 1000 mg via ORAL

## 2022-07-06 MED ORDER — OXYCODONE HCL 5 MG PO TABS: 5.0000 mg | ORAL_TABLET | Freq: Once | ORAL | Status: DC | PRN

## 2022-07-06 MED ORDER — BUPIVACAINE-EPINEPHRINE 0.25% -1:200000 IJ SOLN
INTRAMUSCULAR | Status: DC | PRN
  Administered 2022-07-06: 9 mL

## 2022-07-06 MED ORDER — CEFAZOLIN SODIUM-DEXTROSE 2-4 GM/100ML-% IV SOLN
INTRAVENOUS | Status: AC
  Filled 2022-07-06: qty 100

## 2022-07-06 MED ORDER — CELECOXIB 200 MG PO CAPS
200.0000 mg | ORAL_CAPSULE | ORAL | Status: AC
  Administered 2022-07-06: 200 mg via ORAL

## 2022-07-06 MED ORDER — AMISULPRIDE (ANTIEMETIC) 5 MG/2ML IV SOLN: 10.0000 mg | Freq: Once | INTRAVENOUS | Status: DC | PRN

## 2022-07-06 MED ORDER — FENTANYL CITRATE (PF) 100 MCG/2ML IJ SOLN: 25.0000 ug | INTRAMUSCULAR | Status: DC | PRN

## 2022-07-06 MED ORDER — PROPOFOL 10 MG/ML IV BOLUS
INTRAVENOUS | Status: AC
  Filled 2022-07-06: qty 20

## 2022-07-06 MED ORDER — LIDOCAINE 2% (20 MG/ML) 5 ML SYRINGE
INTRAMUSCULAR | Status: DC | PRN
  Administered 2022-07-06: 100 mg via INTRAVENOUS

## 2022-07-06 MED ORDER — CELECOXIB 200 MG PO CAPS
ORAL_CAPSULE | ORAL | Status: AC
  Filled 2022-07-06: qty 1

## 2022-07-06 MED ORDER — LACTATED RINGERS IV SOLN: INTRAVENOUS | Status: DC

## 2022-07-06 MED ORDER — PROPOFOL 10 MG/ML IV BOLUS
INTRAVENOUS | Status: DC | PRN
  Administered 2022-07-06: 50 mg via INTRAVENOUS
  Administered 2022-07-06: 150 mg via INTRAVENOUS

## 2022-07-06 MED ORDER — OXYCODONE HCL 5 MG PO TABS
5.0000 mg | ORAL_TABLET | Freq: Four times a day (QID) | ORAL | 0 refills | Status: AC | PRN
Start: 2022-07-06 — End: ?

## 2022-07-06 MED ORDER — ONDANSETRON HCL 4 MG/2ML IJ SOLN
INTRAMUSCULAR | Status: DC | PRN
  Administered 2022-07-06: 4 mg via INTRAVENOUS

## 2022-07-06 SURGICAL SUPPLY — 47 items
ADH SKN CLS APL DERMABOND .7 (GAUZE/BANDAGES/DRESSINGS) ×1
APL PRP STRL LF DISP 70% ISPRP (MISCELLANEOUS) ×1
BLADE SURG 10 STRL SS (BLADE) ×2 IMPLANT
BLADE SURG 15 STRL LF DISP TIS (BLADE) ×2 IMPLANT
BLADE SURG 15 STRL SS (BLADE) ×1
CANISTER SUCT 1200ML W/VALVE (MISCELLANEOUS) IMPLANT
CHLORAPREP W/TINT 26 (MISCELLANEOUS) ×2 IMPLANT
COVER BACK TABLE 60X90IN (DRAPES) ×2 IMPLANT
COVER MAYO STAND STRL (DRAPES) ×2 IMPLANT
DERMABOND ADVANCED .7 DNX12 (GAUZE/BANDAGES/DRESSINGS) ×2 IMPLANT
DRAPE LAPAROTOMY 100X72 PEDS (DRAPES) ×2 IMPLANT
DRAPE UTILITY XL STRL (DRAPES) ×2 IMPLANT
ELECT COATED BLADE 2.86 ST (ELECTRODE) ×2 IMPLANT
ELECT REM PT RETURN 9FT ADLT (ELECTROSURGICAL) ×1
ELECTRODE REM PT RTRN 9FT ADLT (ELECTROSURGICAL) ×2 IMPLANT
GAUZE 4X4 16PLY ~~LOC~~+RFID DBL (SPONGE) IMPLANT
GLOVE BIO SURGEON STRL SZ 6.5 (GLOVE) IMPLANT
GLOVE BIO SURGEON STRL SZ7.5 (GLOVE) ×2 IMPLANT
GLOVE BIOGEL PI IND STRL 7.0 (GLOVE) IMPLANT
GOWN STRL REUS W/ TWL LRG LVL3 (GOWN DISPOSABLE) ×4 IMPLANT
GOWN STRL REUS W/TWL LRG LVL3 (GOWN DISPOSABLE) ×3
NDL HYPO 25X1 1.5 SAFETY (NEEDLE) IMPLANT
NEEDLE HYPO 25X1 1.5 SAFETY (NEEDLE) ×1 IMPLANT
NS IRRIG 1000ML POUR BTL (IV SOLUTION) ×2 IMPLANT
PACK BASIN DAY SURGERY FS (CUSTOM PROCEDURE TRAY) ×2 IMPLANT
PENCIL SMOKE EVACUATOR (MISCELLANEOUS) ×2 IMPLANT
SLEEVE SCD COMPRESS KNEE MED (STOCKING) ×2 IMPLANT
SPIKE FLUID TRANSFER (MISCELLANEOUS) IMPLANT
SPONGE T-LAP 18X18 ~~LOC~~+RFID (SPONGE) ×2 IMPLANT
SUT CHROMIC 3 0 SH 27 (SUTURE) IMPLANT
SUT ETHILON 3 0 PS 1 (SUTURE) IMPLANT
SUT MON AB 4-0 PC3 18 (SUTURE) IMPLANT
SUT PROLENE 3 0 PS 2 (SUTURE) IMPLANT
SUT SILK 2 0 PERMA HAND 18 BK (SUTURE) IMPLANT
SUT VIC AB 3-0 54X BRD REEL (SUTURE) IMPLANT
SUT VIC AB 3-0 BRD 54 (SUTURE)
SUT VIC AB 3-0 FS2 27 (SUTURE) IMPLANT
SUT VIC AB 3-0 SH 27 (SUTURE) ×1
SUT VIC AB 3-0 SH 27X BRD (SUTURE) IMPLANT
SUT VIC AB 4-0 PS2 18 (SUTURE) IMPLANT
SUT VIC AB 4-0 RB1 27 (SUTURE)
SUT VIC AB 4-0 RB1 27X BRD (SUTURE) IMPLANT
SUT VICRYL AB 3 0 TIES (SUTURE) IMPLANT
SYR CONTROL 10ML LL (SYRINGE) IMPLANT
TOWEL GREEN STERILE FF (TOWEL DISPOSABLE) ×4 IMPLANT
TUBE CONNECTING 20X1/4 (TUBING) IMPLANT
YANKAUER SUCT BULB TIP NO VENT (SUCTIONS) IMPLANT

## 2022-07-06 NOTE — H&P (Signed)
REFERRING PHYSICIAN: [place] Crane*  PROVIDER: Lindell Noe, MD  MRN: [id number] DOB: 16-May-1945 Subjective   Chief Complaint: Soft Tissue Mass   History of Present Illness: Stacey Barrett is a 77 y.o. female who is seen today as an office consultation for evaluation of Soft Tissue Mass .   We are asked to see the patient in consultation by Dr. Andi Devon to evaluate her for a mass of the right thigh. The patient is a 77 year old white female who first noticed the mass about 6 months ago. She denies any pain associated with it. She is concerned about what it might be. She has not noticed a change in size since she first noticed it. She does not report any weakness or numbness in the right leg. She does have some hypertension and does not smoke  Review of Systems: A complete review of systems was obtained from the patient. I have reviewed this information and discussed as appropriate with the patient. See HPI as well for other ROS.  ROS   Medical History: Past Medical History:  Diagnosis Date  Arthritis  Cancer (CMS-HCC)   Patient Active Problem List  Diagnosis  Mass of thigh, right   Past Surgical History:  Procedure Laterality Date  breast cancer surgery  fatty tumor removed from gallbladder  HYSTERECTOMY VAGINAL  kidney stoney removal    No Known Allergies  Current Outpatient Medications on File Prior to Visit  Medication Sig Dispense Refill  ezetimibe (ZETIA) 10 mg tablet  sertraline (ZOLOFT) 50 MG tablet   No current facility-administered medications on file prior to visit.   Family History  Problem Relation Age of Onset  High blood pressure (Hypertension) Mother  Hyperlipidemia (Elevated cholesterol) Mother  Breast cancer Mother  Breast cancer Sister  Skin cancer Sister    Social History   Tobacco Use  Smoking Status Never  Smokeless Tobacco Never    Social History   Socioeconomic History  Marital status: Single  Tobacco  Use  Smoking status: Never  Smokeless tobacco: Never  Substance and Sexual Activity  Alcohol use: Yes  Drug use: Never   Objective:   Vitals:  BP: 132/80  Pulse: 80  Temp: 36.2 C (97.1 F)  SpO2: 97%  Weight: 75.7 kg (166 lb 12.8 oz)  Height: 179.1 cm (5' 10.5")  PainSc: 0-No pain   Body mass index is 23.6 kg/m.  Physical Exam Vitals reviewed.  Constitutional:  General: She is not in acute distress. Appearance: Normal appearance.  HENT:  Head: Normocephalic and atraumatic.  Right Ear: External ear normal.  Left Ear: External ear normal.  Nose: Nose normal.  Mouth/Throat:  Mouth: Mucous membranes are moist.  Pharynx: Oropharynx is clear.  Eyes:  General: No scleral icterus. Extraocular Movements: Extraocular movements intact.  Conjunctiva/sclera: Conjunctivae normal.  Pupils: Pupils are equal, round, and reactive to light.  Cardiovascular:  Rate and Rhythm: Normal rate and regular rhythm.  Pulses: Normal pulses.  Heart sounds: Normal heart sounds.  Pulmonary:  Effort: Pulmonary effort is normal. No respiratory distress.  Breath sounds: Normal breath sounds.  Abdominal:  General: Bowel sounds are normal.  Palpations: Abdomen is soft.  Tenderness: There is no abdominal tenderness.  Musculoskeletal:  General: No swelling, tenderness or deformity. Normal range of motion.  Cervical back: Normal range of motion and neck supple.  Comments: There is a fatty feeling mass in the upper inner right thigh with no overlying skin changes. The mass is somewhat mobile but may be deeper than what  we think  Skin: General: Skin is warm and dry.  Coloration: Skin is not jaundiced.  Neurological:  General: No focal deficit present.  Mental Status: She is alert and oriented to person, place, and time.  Psychiatric:  Mood and Affect: Mood normal.  Behavior: Behavior normal.     Labs, Imaging and Diagnostic Testing:  Assessment and Plan:   Diagnoses and all orders for  this visit:  Mass of thigh, right - US extremity nonvascular right limited; Future    The patient appears to have a 4 cm mass that feels somewhat fatty in nature but is a little bit firmer than a typical lipoma and may be somewhat deep in the right upper inner thigh. Because of this I would like to evaluate this with ultrasound prior to considering excision of the area. We will call her with the results of the study. If the mass does look like a lipoma and seems to be superficial to the muscle then we could consider removing it for her. I have discussed with her in detail the risk and benefits of the operation as well as some of the technical aspects and she understands and wishes to proceed. We will again call her with the results of the study and then proceed accordingly.

## 2022-07-06 NOTE — Anesthesia Postprocedure Evaluation (Signed)
Anesthesia Post Note  Patient: ENAS SUERO  Procedure(s) Performed: EXCISION MASS RIGHT UPPER THIGH (Right: Leg Upper)     Patient location during evaluation: PACU Anesthesia Type: General Level of consciousness: awake Pain management: pain level controlled Vital Signs Assessment: post-procedure vital signs reviewed and stable Respiratory status: spontaneous breathing, nonlabored ventilation and respiratory function stable Cardiovascular status: blood pressure returned to baseline and stable Postop Assessment: no apparent nausea or vomiting Anesthetic complications: no   No notable events documented.  Last Vitals:  Vitals:   07/06/22 1015 07/06/22 1030  BP: (!) 140/73 (!) 140/80  Pulse: 74 75  Resp: 12 12  Temp:    SpO2: 94% 93%    Last Pain:  Vitals:   07/06/22 1030  TempSrc:   PainSc: 0-No pain                 Linton Rump

## 2022-07-06 NOTE — Discharge Instructions (Addendum)
  Post Anesthesia Home Care Instructions  Activity: Get plenty of rest for the remainder of the day. A responsible individual must stay with you for 24 hours following the procedure.  For the next 24 hours, DO NOT: -Drive a car -Advertising copywriter -Drink alcoholic beverages -Take any medication unless instructed by your physician -Make any legal decisions or sign important papers.  Meals: Start with liquid foods such as gelatin or soup. Progress to regular foods as tolerated. Avoid greasy, spicy, heavy foods. If nausea and/or vomiting occur, drink only clear liquids until the nausea and/or vomiting subsides. Call your physician if vomiting continues.  Special Instructions/Symptoms: Your throat may feel dry or sore from the anesthesia or the breathing tube placed in your throat during surgery. If this causes discomfort, gargle with warm salt water. The discomfort should disappear within 24 hours.  If you had a scopolamine patch placed behind your ear for the management of post- operative nausea and/or vomiting:  1. The medication in the patch is effective for 72 hours, after which it should be removed.  Wrap patch in a tissue and discard in the trash. Wash hands thoroughly with soap and water. 2. You may remove the patch earlier than 72 hours if you experience unpleasant side effects which may include dry mouth, dizziness or visual disturbances. 3. Avoid touching the patch. Wash your hands with soap and water after contact with the patch.      Next dose of Tylenol may be given at 2:30pm if needed. Next dose of NSAIDs (Ibuprofen/Motrin/Aleve) may be given at 2:30pm if needed.

## 2022-07-06 NOTE — Transfer of Care (Signed)
Immediate Anesthesia Transfer of Care Note  Patient: Stacey Barrett  Procedure(s) Performed: EXCISION MASS RIGHT UPPER THIGH (Right: Leg Upper)  Patient Location: PACU  Anesthesia Type:General  Level of Consciousness: drowsy  Airway & Oxygen Therapy: Patient Spontanous Breathing and Patient connected to face mask oxygen  Post-op Assessment: Report given to RN and Post -op Vital signs reviewed and stable  Post vital signs: Reviewed and stable  Last Vitals:  Vitals Value Taken Time  BP 166/93 07/06/22 1005  Temp    Pulse 79 07/06/22 1006  Resp 11 07/06/22 1006  SpO2 99 % 07/06/22 1006  Vitals shown include unvalidated device data.  Last Pain:  Vitals:   07/06/22 0819  TempSrc: Oral  PainSc: 0-No pain      Patients Stated Pain Goal: 4 (07/06/22 0819)  Complications: No notable events documented.

## 2022-07-06 NOTE — Anesthesia Procedure Notes (Signed)
Procedure Name: LMA Insertion Date/Time: 07/06/2022 9:21 AM  Performed by: Demetrio Lapping, CRNAPre-anesthesia Checklist: Patient identified, Emergency Drugs available, Suction available and Patient being monitored Patient Re-evaluated:Patient Re-evaluated prior to induction Oxygen Delivery Method: Circle System Utilized Preoxygenation: Pre-oxygenation with 100% oxygen Induction Type: IV induction Ventilation: Mask ventilation without difficulty LMA: LMA inserted LMA Size: 4.0 Number of attempts: 1 Airway Equipment and Method: Bite block Placement Confirmation: positive ETCO2 Tube secured with: Tape Dental Injury: Teeth and Oropharynx as per pre-operative assessment

## 2022-07-06 NOTE — Op Note (Addendum)
07/06/2022  9:58 AM  PATIENT:  Stacey Barrett  77 y.o. female  PRE-OPERATIVE DIAGNOSIS:  MASS RIGHT THIGH  POST-OPERATIVE DIAGNOSIS:  MASS RIGHT THIGH  PROCEDURE:  Procedure(s): EXCISION MASS RIGHT UPPER THIGH (Right)  SURGEON:  Surgeon(s) and Role:    * Griselda Miner, MD - Primary  PHYSICIAN ASSISTANT:   ASSISTANTS: none   ANESTHESIA:   local and general  EBL:  10 mL   BLOOD ADMINISTERED:none  DRAINS: none   LOCAL MEDICATIONS USED:  MARCAINE     SPECIMEN:  Source of Specimen:  mass right thigh  DISPOSITION OF SPECIMEN:  PATHOLOGY  COUNTS:  YES  TOURNIQUET:  * No tourniquets in log *  DICTATION: .Dragon Dictation  After informed consent was obtained the patient was brought to the operating room and placed in the supine position on the operating table.  After adequate induction of general anesthesia the patient's right area was prepped with ChloraPrep, allowed to dry, and draped in usual sterile manner.  An appropriate timeout was performed.  The patient had a palpable mass in the upper inner thigh.  The area around this was infiltrated with quarter percent Marcaine.  A longitudinally oriented incision was made with a 15 blade knife overlying the palpable mass.  The incision was carried through the skin and subcutaneous tissue sharply with the electrocautery.  Dissection was then carried through the subcutaneous tissue sharply with the electrocautery until the mass was visualized.  Dissection was then carried around the edge of the palpable mass.  At 1 point I did encounter some clear yellow fluid draining from the area which was likely within the mass.  The mass was likely a cyst.  The wall of the cyst was excised sharply with the electrocautery in its entirety.  Once this was accomplished the entire cyst wall was removed from the patient and sent to pathology for further evaluation.  No other abnormalities were noted on inspection of the area.  Hemostasis was achieved  using the Bovie electrocautery.  The incision was then closed with 2 layers of interrupted 3-0 Vicryl stitches.  The skin was then closed with a running 4-0 Monocryl subcuticular stitch.  Dermabond dressings were applied.  The patient tolerated the procedure well.  At the end of the case all needle sponge and instrument counts were correct.  The patient was then awakened and taken to recovery in stable condition.  Of note there was a small neurovascular bundle also running longitudinally at the base of the dissection area.  Care was taken to avoid any injury to the structure.The mass measured 3.5x3cm  PLAN OF CARE: Discharge to home after PACU  PATIENT DISPOSITION:  PACU - hemodynamically stable.   Delay start of Pharmacological VTE agent (>24hrs) due to surgical blood loss or risk of bleeding: not applicable

## 2022-07-06 NOTE — Interval H&P Note (Signed)
History and Physical Interval Note:  07/06/2022 9:01 AM  Stacey Barrett  has presented today for surgery, with the diagnosis of MASS RIGHT THIGH.  The various methods of treatment have been discussed with the patient and family. After consideration of risks, benefits and other options for treatment, the patient has consented to  Procedure(s): EXCISION MASS RIGHT THIGH (Right) as a surgical intervention.  The patient's history has been reviewed, patient examined, no change in status, stable for surgery.  I have reviewed the patient's chart and labs.  Questions were answered to the patient's satisfaction.     [name]

## 2022-07-08 ENCOUNTER — Encounter (HOSPITAL_BASED_OUTPATIENT_CLINIC_OR_DEPARTMENT_OTHER): Payer: Self-pay | Admitting: General Surgery

## 2022-07-09 LAB — SURGICAL PATHOLOGY

## 2022-12-26 ENCOUNTER — Ambulatory Visit
Admission: RE | Admit: 2022-12-26 | Discharge: 2022-12-26 | Disposition: A | Discharge status: Home / Self Care | Payer: Medicare Other | Source: Ambulatory Visit | Attending: Internal Medicine | Admitting: Internal Medicine

## 2022-12-26 ENCOUNTER — Other Ambulatory Visit: Payer: Self-pay | Admitting: Internal Medicine

## 2022-12-26 DIAGNOSIS — J209 Acute bronchitis, unspecified: Secondary | ICD-10-CM

## 2023-01-14 ENCOUNTER — Other Ambulatory Visit: Payer: Self-pay | Admitting: Internal Medicine

## 2023-01-14 DIAGNOSIS — R911 Solitary pulmonary nodule: Secondary | ICD-10-CM

## 2023-01-31 ENCOUNTER — Other Ambulatory Visit: Payer: Self-pay | Admitting: Internal Medicine

## 2023-01-31 DIAGNOSIS — Z9889 Other specified postprocedural states: Secondary | ICD-10-CM

## 2023-02-05 ENCOUNTER — Ambulatory Visit
Admission: RE | Admit: 2023-02-05 | Discharge: 2023-02-05 | Disposition: A | Discharge status: Home / Self Care | Payer: Medicare Other | Source: Ambulatory Visit | Attending: Internal Medicine | Admitting: Internal Medicine

## 2023-02-05 DIAGNOSIS — R911 Solitary pulmonary nodule: Secondary | ICD-10-CM

## 2023-03-06 ENCOUNTER — Other Ambulatory Visit: Payer: Self-pay | Admitting: Internal Medicine

## 2023-03-06 ENCOUNTER — Ambulatory Visit
Admission: RE | Admit: 2023-03-06 | Discharge: 2023-03-06 | Disposition: A | Discharge status: Home / Self Care | Payer: Medicare Other | Source: Ambulatory Visit | Attending: Internal Medicine | Admitting: Internal Medicine

## 2023-03-06 DIAGNOSIS — Z9889 Other specified postprocedural states: Secondary | ICD-10-CM
